# Patient Record
Sex: Female | Born: 1985 | Race: White | Hispanic: No | Marital: Single | Smoking: Current every day smoker
Health system: Southern US, Community
[De-identification: ages and names within clinical notes are randomized; demographics above are authoritative.]

## PROBLEM LIST (undated history)

## (undated) DIAGNOSIS — J45909 Unspecified asthma, uncomplicated: Secondary | ICD-10-CM

---

## 2007-11-07 ENCOUNTER — Ambulatory Visit: Payer: Self-pay | Admitting: Internal Medicine

## 2010-02-07 ENCOUNTER — Ambulatory Visit: Payer: Self-pay | Admitting: Internal Medicine

## 2010-02-08 ENCOUNTER — Emergency Department: Payer: Self-pay | Admitting: Emergency Medicine

## 2010-02-08 ENCOUNTER — Inpatient Hospital Stay: Payer: Self-pay | Admitting: Specialist

## 2011-09-08 ENCOUNTER — Emergency Department: Payer: Self-pay | Admitting: Emergency Medicine

## 2017-09-24 ENCOUNTER — Encounter: Payer: Self-pay | Admitting: Emergency Medicine

## 2017-09-24 ENCOUNTER — Emergency Department: Payer: Self-pay

## 2017-09-24 ENCOUNTER — Emergency Department
Admission: EM | Admit: 2017-09-24 | Discharge: 2017-09-24 | Disposition: A | Discharge status: Home / Self Care | Payer: Self-pay | Attending: Student in an Organized Health Care Education/Training Program | Admitting: Student in an Organized Health Care Education/Training Program

## 2017-09-24 DIAGNOSIS — J40 Bronchitis, not specified as acute or chronic: Secondary | ICD-10-CM | POA: Insufficient documentation

## 2017-09-24 DIAGNOSIS — F17200 Nicotine dependence, unspecified, uncomplicated: Secondary | ICD-10-CM | POA: Insufficient documentation

## 2017-09-24 HISTORY — DX: Unspecified asthma, uncomplicated: J45.909

## 2017-09-24 MED ORDER — BENZONATATE 100 MG PO CAPS: ORAL_CAPSULE | ORAL | 0 refills | Status: DC

## 2017-09-24 MED ORDER — IPRATROPIUM-ALBUTEROL 0.5-2.5 (3) MG/3ML IN SOLN
3.0000 mL | Freq: Once | RESPIRATORY_TRACT | Status: AC
  Administered 2017-09-24: 3 mL via RESPIRATORY_TRACT
  Filled 2017-09-24: qty 3

## 2017-09-24 MED ORDER — PREDNISONE 20 MG PO TABS
60.0000 mg | ORAL_TABLET | Freq: Once | ORAL | Status: AC
  Administered 2017-09-24: 60 mg via ORAL
  Filled 2017-09-24: qty 3

## 2017-09-24 MED ORDER — PREDNISONE 10 MG (21) PO TBPK: ORAL_TABLET | ORAL | 0 refills | Status: DC

## 2017-09-24 NOTE — ED Provider Notes (Signed)
[facility] Emergency Department Provider Note ____________________________________________  Time seen: 1312  I have reviewed the triage vital signs and the nursing notes.  HISTORY  Chief Complaint  Shortness of Breath  HPI Sally Finley is a 31 y.o. female presents to the ED for a one-week complaint of) cough and congestion. Patient describes she was treating herself at home for cold symptoms about 2 weeks prior. She been taking DayQuil and NyQuil without significant benefit. She describes symptoms resolved and then about 3 days prior symptoms returned. She presents now with chest tightness, shortness of breath, and intermittently productive cough. She denies any interim fevers, chills, or sweats. She also denies any recent travel, sick contacts, or other exposures.  She does admit to one episode of cough-induced vomiting this morning.   Past Medical History:  Diagnosis Date  . Asthma     There are no active problems to display for this patient.  History reviewed. No pertinent surgical history.  Prior to Admission medications   Medication Sig Start Date End Date Taking? Authorizing Provider  benzonatate (TESSALON PERLES) 100 MG capsule Take 1-2 tabs TID prn cough 09/24/17   Zeven Kocak, Charlesetta IvoryJenise V Bacon, PA-C  predniSONE (STERAPRED UNI-PAK 21 TAB) 10 MG (21) TBPK tablet 6-day taper as directed. 09/24/17   Alisea Matte, Charlesetta IvoryJenise V Bacon, PA-C    Allergies Morphine and related and Phenergan [promethazine hcl]  History reviewed. No pertinent family history.  Social History Social History  Substance Use Topics  . Smoking status: Current Every Day Smoker  . Smokeless tobacco: Never Used  . Alcohol use No   Review of Systems  Constitutional: Negative for fever. Eyes: Negative for visual changes. ENT: Negative for sore throat. Cardiovascular: Negative for chest pain. Respiratory:  Positive for shortness of breath. Intermittently productive cough Gastrointestinal:  Negative for abdominal pain, vomiting and diarrhea. Skin: Negative for rash. Neurological: Negative for headaches, focal weakness or numbness. ____________________________________________  PHYSICAL EXAM:  VITAL SIGNS: ED Triage Vitals [09/24/17 1127]  Enc Vitals Group     BP 109/78     Pulse Rate (!) 54     Resp 20     Temp (!) 97.3 F (36.3 C)     Temp Source Oral     SpO2 95 %     Weight 175 lb (79.4 kg)     Height      Head Circumference      Peak Flow      Pain Score 0     Pain Loc      Pain Edu?      Excl. in GC?    Constitutional: Alert and oriented. Well appearing and in no distress. Head: Normocephalic and atraumatic. Eyes: Conjunctivae are normal. PERRL. Normal extraocular movements Ears: Canals clear. TMs intact bilaterally. Nose: No congestion/rhinorrhea/epistaxis. Mouth/Throat: Mucous membranes are moist. Uvula is midline and tonsils are flat. Neck: Supple. No thyromegaly. Hematological/Lymphatic/Immunological: No cervical lymphadenopathy. Cardiovascular: Normal rate, regular rhythm. Normal distal pulses. Respiratory: Normal respiratory effort. Mild end-expiratory wheeze noted bilaterally. No rales/rhonchi. Musculoskeletal: Nontender with normal range of motion in all extremities.  Neurologic:  Normal gait without ataxia. Normal speech and language. No gross focal neurologic deficits are appreciated. Skin:  Skin is warm, dry and intact. No rash noted. ____________________________________________   RADIOLOGY  CXR  IMPRESSION: No edema or consolidation. ____________________________________________  PROCEDURES  DuoNeb x 1 Prednisone 60 mg PO ____________________________________________  INITIAL IMPRESSION / ASSESSMENT AND PLAN / ED COURSE  Patient with ED evaluation of persistent  cough for the last 3-4 weeks. The patient was and was overall benign without any acute respiratory distress. X-ray did not show any acute infectious process. Patient was  treated for a viral bronchitis at this time. She given a DuoNeb treatment in the ED and a dose of prednisone here. She'll be discharged with a prescription for a prednisone taper pack as well as Tessalon Perles. She is encouraged to start an over-the-counter daily allergy medicine as well as Delsym for cough suppression. She [facility] or return to the ED as needed. ____________________________________________  FINAL CLINICAL IMPRESSION(S) / ED DIAGNOSES  Final diagnoses:  Bronchitis      Myranda Pavone, Charlesetta Ivory, PA-C 09/24/17 1656    Willy Eddy, MD 09/26/17 [phone]

## 2017-09-24 NOTE — ED Notes (Signed)
NAD noted at time of D/C. Pt denies questions or concerns. Pt ambulatory to the lobby at this time.  

## 2017-09-24 NOTE — Discharge Instructions (Signed)
Your exam and chest x-ray are consistent with a likely viral bronchitis. Take the prescription meds as directed. Use your inhaler as prescribed. Take OTC Delsym for additional cough relief. Follow-up with your provider or [facility] for continued symptoms.

## 2017-09-24 NOTE — ED Triage Notes (Addendum)
Pt to ed with c/o cough and congestion x 1 month,  States she now feels like she can't breathe well.  resp even and unlabored.  Pt sats wnl.  RR 20.  Skin warm and dry.  Pt alert and oriented.

## 2018-01-09 ENCOUNTER — Emergency Department
Admission: EM | Admit: 2018-01-09 | Discharge: 2018-01-09 | Disposition: A | Discharge status: Home / Self Care | Payer: Self-pay | Attending: Emergency Medicine | Admitting: Emergency Medicine

## 2018-01-09 ENCOUNTER — Other Ambulatory Visit: Payer: Self-pay

## 2018-01-09 ENCOUNTER — Encounter: Payer: Self-pay | Admitting: Emergency Medicine

## 2018-01-09 DIAGNOSIS — Z87891 Personal history of nicotine dependence: Secondary | ICD-10-CM | POA: Insufficient documentation

## 2018-01-09 DIAGNOSIS — J101 Influenza due to other identified influenza virus with other respiratory manifestations: Secondary | ICD-10-CM | POA: Insufficient documentation

## 2018-01-09 DIAGNOSIS — J45909 Unspecified asthma, uncomplicated: Secondary | ICD-10-CM | POA: Insufficient documentation

## 2018-01-09 LAB — GROUP A STREP BY PCR: GROUP A STREP BY PCR: NOT DETECTED

## 2018-01-09 MED ORDER — IPRATROPIUM-ALBUTEROL 0.5-2.5 (3) MG/3ML IN SOLN
3.0000 mL | Freq: Once | RESPIRATORY_TRACT | Status: AC
  Administered 2018-01-09: 3 mL via RESPIRATORY_TRACT
  Filled 2018-01-09: qty 3

## 2018-01-09 MED ORDER — ALBUTEROL SULFATE HFA 108 (90 BASE) MCG/ACT IN AERS: 2.0000 | INHALATION_SPRAY | Freq: Four times a day (QID) | RESPIRATORY_TRACT | 2 refills | Status: DC | PRN

## 2018-01-09 MED ORDER — OSELTAMIVIR PHOSPHATE 75 MG PO CAPS: 75.0000 mg | ORAL_CAPSULE | Freq: Two times a day (BID) | ORAL | 0 refills | Status: AC

## 2018-01-09 NOTE — ED Triage Notes (Signed)
Patient with complaint of fever, congestion, body aches and sore throat times two days. Patient states that her temperature was 102 last night.

## 2018-01-09 NOTE — ED Notes (Signed)
Pt presents with c/o flu like symptoms; given mask to put on but pt refusing as she has asthma

## 2018-01-09 NOTE — ED Provider Notes (Signed)
[facility] Emergency Department Provider Note  ____________________________________________  Time seen: Approximately 10:20 PM  I have reviewed the triage vital signs and the nursing notes.   HISTORY  Chief Complaint Fever; Nasal Congestion; and Generalized Body Aches    HPI Sally Finley is a 32 y.o. female presents to the emergency department with headache, fever, pharyngitis, myalgias, diarrhea, emesis and malaise for the past 2 days.  Patient's girlfriend was diagnosed with influenza A yesterday.  Patient denies chest pain, chest tightness and abdominal pain.  No alleviating measures of been attempted.   Past Medical History:  Diagnosis Date  . Asthma     There are no active problems to display for this patient.   History reviewed. No pertinent surgical history.  Prior to Admission medications   Medication Sig Start Date End Date Taking? Authorizing Provider  benzonatate (TESSALON PERLES) 100 MG capsule Take 1-2 tabs TID prn cough 09/24/17   Menshew, Charlesetta Ivory, PA-C  oseltamivir (TAMIFLU) 75 MG capsule Take 1 capsule (75 mg total) by mouth 2 (two) times daily for 5 days. 01/09/18 01/14/18  Orvil Feil, PA-C  predniSONE (STERAPRED UNI-PAK 21 TAB) 10 MG (21) TBPK tablet 6-day taper as directed. 09/24/17   Menshew, Charlesetta Ivory, PA-C    Allergies Morphine and related and Phenergan [promethazine hcl]  No family history on file.  Social History Social History   Tobacco Use  . Smoking status: Former Games developer  . Smokeless tobacco: Never Used  Substance Use Topics  . Alcohol use: No  . Drug use: No      Review of Systems  Constitutional: Patient has fever.  Eyes: No visual changes. No discharge ENT: Patient has congestion.  Cardiovascular: no chest pain. Respiratory: Patient has cough.  Gastrointestinal: No abdominal pain.  No nausea, no vomiting. Patient had diarrhea.  Genitourinary: Negative for dysuria. No  hematuria Musculoskeletal: Patient has myalgias.  Skin: Negative for rash, abrasions, lacerations, ecchymosis. Neurological: Patient has headache, no focal weakness or numbness.    ____________________________________________   PHYSICAL EXAM:  VITAL SIGNS: ED Triage Vitals  Enc Vitals Group     BP 01/09/18 2019 104/70     Pulse Rate 01/09/18 2016 91     Resp 01/09/18 2016 18     Temp 01/09/18 2016 98.5 F (36.9 C)     Temp src --      SpO2 01/09/18 2016 98 %     Weight 01/09/18 2016 175 lb (79.4 kg)     Height 01/09/18 2016 5\' 1"  (1.549 m)     Head Circumference --      Peak Flow --      Pain Score 01/09/18 2016 7     Pain Loc --      Pain Edu? --      Excl. in GC? --      Constitutional: Alert and oriented. Patient is lying supine. Eyes: Conjunctivae are normal. PERRL. EOMI. Head: Atraumatic. ENT:      Ears: Tympanic membranes are mildly injected with mild effusion bilaterally.       Nose: No congestion/rhinnorhea.      Mouth/Throat: Mucous membranes are moist. Posterior pharynx is mildly erythematous.  Hematological/Lymphatic/Immunilogical: No cervical lymphadenopathy.  Cardiovascular: Normal rate, regular rhythm. Normal S1 and S2.  Good peripheral circulation. Respiratory: Normal respiratory effort without tachypnea or retractions. Lungs CTAB. Good air entry to the bases with no decreased or absent breath sounds. Gastrointestinal: Bowel sounds 4 quadrants. Soft and nontender to  palpation. No guarding or rigidity. No palpable masses. No distention. No CVA tenderness. Musculoskeletal: Full range of motion to all extremities. No gross deformities appreciated. Neurologic:  Normal speech and language. No gross focal neurologic deficits are appreciated.  Skin:  Skin is warm, dry and intact. No rash noted. Psychiatric: Mood and affect are normal. Speech and behavior are normal. Patient exhibits appropriate insight and  judgement.   ____________________________________________   LABS (all labs ordered are listed, but only abnormal results are displayed)  Labs Reviewed  GROUP A STREP BY PCR   ____________________________________________  EKG   ____________________________________________  RADIOLOGY   No results found.  ____________________________________________    PROCEDURES  Procedure(s) performed:    Procedures    Medications  ipratropium-albuterol (DUONEB) 0.5-2.5 (3) MG/3ML nebulizer solution 3 mL (not administered)     ____________________________________________   INITIAL IMPRESSION / ASSESSMENT AND PLAN / ED COURSE  Pertinent labs & imaging results that were available during my care of the patient were reviewed by me and considered in my medical decision making (see chart for details).  Review of the [place] CSRS was performed in accordance of the [facility] prior to dispensing any controlled drugs.     Assessment and plan Influenza A Differential diagnosis included influenza versus unspecified viral URI.  With sick contacts in the home with diagnosed influenza A, influenza A is likely at this time.  Patient was started on Tamiflu.  Rest and hydration were encouraged.  Patient was advised to follow-up with primary care as needed.  All patient questions were answered.    ____________________________________________  FINAL CLINICAL IMPRESSION(S) / ED DIAGNOSES  Final diagnoses:  Influenza A      NEW MEDICATIONS STARTED DURING THIS VISIT:  ED Discharge Orders        Ordered    oseltamivir (TAMIFLU) 75 MG capsule  2 times daily     01/09/18 2215          This chart was dictated using voice recognition software/Dragon. Despite best efforts to proofread, errors can occur which can change the meaning. Any change was purely unintentional.    Orvil FeilWoods, Jacki Couse M, PA-C 01/09/18 2228    Pia MauWoods, Elana Jian KalifornskyM, PA-C 01/09/18 2236    Jeanmarie PlantMcShane, James A, MD 01/09/18  [phone]

## 2018-04-15 ENCOUNTER — Ambulatory Visit
Admission: EM | Admit: 2018-04-15 | Discharge: 2018-04-15 | Disposition: A | Discharge status: Home / Self Care | Payer: Self-pay | Attending: Family Medicine | Admitting: Family Medicine

## 2018-04-15 ENCOUNTER — Other Ambulatory Visit: Payer: Self-pay

## 2018-04-15 DIAGNOSIS — H6692 Otitis media, unspecified, left ear: Secondary | ICD-10-CM

## 2018-04-15 DIAGNOSIS — J4521 Mild intermittent asthma with (acute) exacerbation: Secondary | ICD-10-CM

## 2018-04-15 MED ORDER — HYDROCOD POLST-CPM POLST ER 10-8 MG/5ML PO SUER: 5.0000 mL | Freq: Every evening | ORAL | 0 refills | Status: DC | PRN

## 2018-04-15 MED ORDER — AMOXICILLIN 875 MG PO TABS: 875.0000 mg | ORAL_TABLET | Freq: Two times a day (BID) | ORAL | 0 refills | Status: DC

## 2018-04-15 MED ORDER — PREDNISONE 50 MG PO TABS: ORAL_TABLET | ORAL | 0 refills | Status: DC

## 2018-04-15 MED ORDER — ALBUTEROL SULFATE HFA 108 (90 BASE) MCG/ACT IN AERS: 1.0000 | INHALATION_SPRAY | Freq: Four times a day (QID) | RESPIRATORY_TRACT | 1 refills | Status: DC | PRN

## 2018-04-15 MED ORDER — ALBUTEROL SULFATE (2.5 MG/3ML) 0.083% IN NEBU
2.5000 mg | INHALATION_SOLUTION | Freq: Once | RESPIRATORY_TRACT | Status: AC
  Administered 2018-04-15: 2.5 mg via RESPIRATORY_TRACT

## 2018-04-15 NOTE — Discharge Instructions (Signed)
Meds as prescribed. ° °Take care ° °Dr. Harleigh Civello  °

## 2018-04-15 NOTE — ED Provider Notes (Signed)
[facility]  CSN: [id number] Arrival date & time: 04/15/18  1358  History   Chief Complaint Chief Complaint  Patient presents with  . Cough   HPI  32 year old female presents with respiratory symptoms.  Patient has been sick for the past week.  She is had sore throat, congestion, ear pain, cough and wheezing.  No documented fever.  She states that she has asthma and is out of her inhaler.  She is having difficulty breathing.  Worse at night.  No known relieving factors.  No other associated symptoms.  No other complaints or concerns at this time.  Past Medical History:  Diagnosis Date  . Asthma    Surgical hx: No past surgeries.  OB History   None    Family History No reported family hx.  Social History Social History   Tobacco Use  . Smoking status: Current Every Day Smoker    Packs/day: 0.50    Types: Cigarettes  . Smokeless tobacco: Never Used  Substance Use Topics  . Alcohol use: No  . Drug use: No    Allergies   Morphine and related and Phenergan [promethazine hcl]   Review of Systems Review of Systems Per HPI  Physical Exam Triage Vital Signs ED Triage Vitals  Enc Vitals Group     BP 04/15/18 1418 117/79     Pulse Rate 04/15/18 1418 96     Resp 04/15/18 1418 20     Temp 04/15/18 1418 97.9 F (36.6 C)     Temp Source 04/15/18 1418 Oral     SpO2 04/15/18 1418 96 %     Weight 04/15/18 1417 170 lb (77.1 kg)     Height 04/15/18 1417  (1.549 m)     Head Circumference --      Peak Flow --      Pain Score 04/15/18 1417 3     Pain Loc --      Pain Edu? --      Excl. in GC? --    Updated Vital Signs BP 117/79 (BP Location: Right Arm)   Pulse 96   Temp 97.9 F (36.6 C) (Oral)   Resp 20   Ht  (1.549 m)   Wt 170 lb (77.1 kg)   LMP 04/06/2018   SpO2 96%   BMI 32.12 kg/m   Physical Exam  Constitutional: She is oriented to person, place, and time. She appears well-developed. No distress.  HENT:  Head: Normocephalic and  atraumatic.  Oropharynx with enlarged tonsils.  Mild erythema. Left TM with erythema and dullness.  Eyes: Conjunctivae are normal. Right eye exhibits no discharge. Left eye exhibits no discharge.  Cardiovascular: Normal rate and regular rhythm.  Pulmonary/Chest:  No increased work of breathing.  Diffuse expiratory wheezing.  Neurological: She is alert and oriented to person, place, and time.  Psychiatric: She has a normal mood and affect. Her behavior is normal.  Nursing note and vitals reviewed.  UC Treatments / Results  Labs (all labs ordered are listed, but only abnormal results are displayed) Labs Reviewed - No data to display  EKG None  Radiology No results found.  Procedures Procedures (including critical care time)  Medications Ordered in UC Medications  albuterol (PROVENTIL) (2.5 MG/3ML) 0.083% nebulizer solution 2.5 mg (2.5 mg Nebulization Given 04/15/18 1435)    Initial Impression / Assessment and Plan / UC Course  I have reviewed the triage vital signs and the nursing notes.  Pertinent labs & imaging results that  were available during my care of the patient were reviewed by me and considered in my medical decision making (see chart for details).    32 year old female presents with asthma exacerbation.  Treating with prednisone, albuterol.  Tussionex for cough.  Also noted to have otitis media.  Treating with amoxicillin.  Final Clinical Impressions(s) / UC Diagnoses   Final diagnoses:  Mild intermittent asthma with exacerbation  Left otitis media, unspecified otitis media type     Discharge Instructions     Meds as prescribed.  Take care  Dr. Adriana Simas    ED Prescriptions    Medication Sig Dispense Auth. Provider   albuterol (PROVENTIL HFA;VENTOLIN HFA) 108 (90 Base) MCG/ACT inhaler Inhale 1-2 puffs into the lungs every 6 (six) hours as needed for wheezing or shortness of breath. 2 Inhaler Lauris Keepers G, DO   chlorpheniramine-HYDROcodone (TUSSIONEX  PENNKINETIC ER) 10-8 MG/5ML SUER Take 5 mLs by mouth at bedtime as needed. 60 mL Jeral Zick G, DO   predniSONE (DELTASONE) 50 MG tablet 1 tablet daily x 5 days. 5 tablet Makenzye Troutman G, DO   amoxicillin (AMOXIL) 875 MG tablet Take 1 tablet (875 mg total) by mouth 2 (two) times daily. 14 tablet Tommie Sams, DO     Controlled Substance Prescriptions [place] Controlled Substance Registry consulted? Not Applicable   Tommie Sams, DO 04/15/18 1503

## 2018-04-15 NOTE — ED Triage Notes (Signed)
Pt is out of her inhaler a few months ago. She has been having cold sx over last week and feels like its harder to breath when she's lying down at night. Needs new inhaler prescription.

## 2018-07-24 ENCOUNTER — Ambulatory Visit
Admission: EM | Admit: 2018-07-24 | Discharge: 2018-07-24 | Disposition: A | Discharge status: Home / Self Care | Payer: Self-pay | Attending: Internal Medicine | Admitting: Internal Medicine

## 2018-07-24 ENCOUNTER — Other Ambulatory Visit: Payer: Self-pay

## 2018-07-24 ENCOUNTER — Encounter: Payer: Self-pay | Admitting: Gynecology

## 2018-07-24 DIAGNOSIS — J0120 Acute ethmoidal sinusitis, unspecified: Secondary | ICD-10-CM

## 2018-07-24 DIAGNOSIS — R062 Wheezing: Secondary | ICD-10-CM

## 2018-07-24 MED ORDER — AMOXICILLIN 875 MG PO TABS: 875.0000 mg | ORAL_TABLET | Freq: Two times a day (BID) | ORAL | 0 refills | Status: DC

## 2018-07-24 MED ORDER — FLUTICASONE PROPIONATE 50 MCG/ACT NA SUSP: 1.0000 | Freq: Every day | NASAL | 2 refills | Status: DC

## 2018-07-24 MED ORDER — IPRATROPIUM-ALBUTEROL 0.5-2.5 (3) MG/3ML IN SOLN
3.0000 mL | Freq: Once | RESPIRATORY_TRACT | Status: AC
  Administered 2018-07-24: 3 mL via RESPIRATORY_TRACT

## 2018-07-24 MED ORDER — ALBUTEROL SULFATE HFA 108 (90 BASE) MCG/ACT IN AERS: 1.0000 | INHALATION_SPRAY | Freq: Four times a day (QID) | RESPIRATORY_TRACT | 1 refills | Status: DC | PRN

## 2018-07-24 NOTE — ED Provider Notes (Signed)
[facility]    CSN: [id number] Arrival date & time: 07/24/18  1309     History   Chief Complaint Chief Complaint  Patient presents with  . Sinusitis    HPI Sally Finley is a 32 y.o. female.   Patient complains of facial pressure and nasal drainage x1 week. Reports subjective fevers. Drainage is dark yellow.      Past Medical History:  Diagnosis Date  . Asthma     There are no active problems to display for this patient.   History reviewed. No pertinent surgical history.  OB History   None      Home Medications    Prior to Admission medications   Medication Sig Start Date End Date Taking? Authorizing Provider  albuterol (PROVENTIL HFA;VENTOLIN HFA) 108 (90 Base) MCG/ACT inhaler Inhale 1-2 puffs into the lungs every 6 (six) hours as needed for wheezing or shortness of breath. 07/24/18   Arnaldo Nataliamond, Daena Alper S, MD  amoxicillin (AMOXIL) 875 MG tablet Take 1 tablet (875 mg total) by mouth 2 (two) times daily. 07/24/18   Arnaldo Nataliamond, Keymari Sato S, MD  chlorpheniramine-HYDROcodone [facility](TUSSIONEX PENNKINETIC ER) 10-8 MG/5ML SUER Take 5 mLs by mouth at bedtime as needed. 04/15/18   Tommie Samsook, Jayce G, DO  fluticasone (FLONASE) 50 MCG/ACT nasal spray Place 1 spray into both nostrils daily. 07/24/18   Arnaldo Nataliamond, Edgard Debord S, MD  predniSONE (DELTASONE) 50 MG tablet 1 tablet daily x 5 days. 04/15/18   Tommie Samsook, Jayce G, DO    Family History History reviewed. No pertinent family history.  Social History Social History   Tobacco Use  . Smoking status: Current Every Day Smoker    Packs/day: 0.50    Types: Cigarettes  . Smokeless tobacco: Never Used  Substance Use Topics  . Alcohol use: No  . Drug use: No     Allergies   Morphine and related and Phenergan [promethazine hcl]   Review of Systems Review of Systems  Constitutional: Positive for fever (subjective). Negative for chills.  HENT: Positive for congestion. Negative for sore throat and tinnitus.   Eyes: Negative for  redness.  Respiratory: Negative for cough and shortness of breath.   Cardiovascular: Negative for chest pain and palpitations.  Gastrointestinal: Negative for abdominal pain, diarrhea, nausea and vomiting.  Genitourinary: Negative for dysuria, frequency and urgency.  Musculoskeletal: Negative for myalgias.  Skin: Negative for rash.       No lesions  Neurological: Negative for weakness.  Hematological: Does not bruise/bleed easily.  Psychiatric/Behavioral: Negative for suicidal ideas.     Physical Exam Triage Vital Signs ED Triage Vitals  Enc Vitals Group     BP 07/24/18 1318 (!) 126/94     Pulse Rate 07/24/18 1318 85     Resp --      Temp 07/24/18 1318 99.1 F (37.3 C)     Temp Source 07/24/18 1318 Oral     SpO2 07/24/18 1318 99 %     Weight 07/24/18 1316 170 lb (77.1 kg)     Height 07/24/18 1316 5' (1.524 m)     Head Circumference --      Peak Flow --      Pain Score 07/24/18 1315 5     Pain Loc --      Pain Edu? --      Excl. in GC? --    No data found.  Updated Vital Signs BP (!) 126/94   Pulse 85   Temp 99.1 F (37.3 C) (Oral)  Ht 5' (1.524 m)   Wt 77.1 kg   LMP 07/03/2018   SpO2 99%   BMI 33.20 kg/m   Visual Acuity Right Eye Distance:   Left Eye Distance:   Bilateral Distance:    Right Eye Near:   Left Eye Near:    Bilateral Near:     Physical Exam  Constitutional: She is oriented to person, place, and time. She appears well-developed and well-nourished. No distress.  HENT:  Head: Normocephalic and atraumatic.  Mouth/Throat: Oropharynx is clear and moist.  Eyes: Pupils are equal, round, and reactive to light. Conjunctivae and EOM are normal. No scleral icterus.  Neck: Normal range of motion. Neck supple. No JVD present. No tracheal deviation present. No thyromegaly present.  Cardiovascular: Normal rate, regular rhythm and normal heart sounds. Exam reveals no gallop and no friction rub.  No murmur heard. Pulmonary/Chest: Effort normal. She has  wheezes (faint bilaterally).  Abdominal: Soft. Bowel sounds are normal. She exhibits no distension. There is no tenderness.  Musculoskeletal: Normal range of motion. She exhibits no edema.  Lymphadenopathy:    She has no cervical adenopathy.  Neurological: She is alert and oriented to person, place, and time. No cranial nerve deficit.  Skin: Skin is warm and dry.  Psychiatric: She has a normal mood and affect. Her behavior is normal. Judgment and thought content normal.  Nursing note and vitals reviewed.    UC Treatments / Results  Labs (all labs ordered are listed, but only abnormal results are displayed) Labs Reviewed - No data to display  EKG None  Radiology No results found.  Procedures Procedures (including critical care time)  Medications Ordered in UC Medications  ipratropium-albuterol (DUONEB) 0.5-2.5 (3) MG/3ML nebulizer solution 3 mL (3 mLs Nebulization Given 07/24/18 1334)    Initial Impression / Assessment and Plan / UC Course  I have reviewed the triage vital signs and the nursing notes.  Pertinent labs & imaging results that were available during my care of the patient were reviewed by me and considered in my medical decision making (see chart for details).     Sinus infx; breathing treatment for active wheezing. Patient improved  Final Clinical Impressions(s) / UC Diagnoses   Final diagnoses:  Acute non-recurrent ethmoidal sinusitis  Wheezing   Discharge Instructions   None    ED Prescriptions    Medication Sig Dispense Auth. Provider   amoxicillin (AMOXIL) 875 MG tablet Take 1 tablet (875 mg total) by mouth 2 (two) times daily. 14 tablet Arnaldo Nataliamond, Webber Michiels S, MD   albuterol (PROVENTIL HFA;VENTOLIN HFA) 108 (90 Base) MCG/ACT inhaler Inhale 1-2 puffs into the lungs every 6 (six) hours as needed for wheezing or shortness of breath. 2 Inhaler Arnaldo Nataliamond, Airyn Ellzey S, MD   fluticasone [facility](FLONASE) 50 MCG/ACT nasal spray Place 1 spray into both nostrils daily. 16  g Arnaldo Nataliamond, Leonard Feigel S, MD     Controlled Substance Prescriptions [place] Controlled Substance Registry consulted? Not Applicable   Arnaldo Nataliamond, Euretha Najarro S, MD 07/24/18 1401

## 2018-07-24 NOTE — ED Triage Notes (Signed)
Patient c/o sinus problem x 1 week. 

## 2018-12-01 ENCOUNTER — Emergency Department
Admission: EM | Admit: 2018-12-01 | Discharge: 2018-12-01 | Disposition: A | Discharge status: Home / Self Care | Payer: Self-pay | Attending: Emergency Medicine | Admitting: Emergency Medicine

## 2018-12-01 ENCOUNTER — Emergency Department: Payer: Self-pay

## 2018-12-01 DIAGNOSIS — Z79899 Other long term (current) drug therapy: Secondary | ICD-10-CM | POA: Insufficient documentation

## 2018-12-01 DIAGNOSIS — J45909 Unspecified asthma, uncomplicated: Secondary | ICD-10-CM | POA: Insufficient documentation

## 2018-12-01 DIAGNOSIS — R070 Pain in throat: Secondary | ICD-10-CM | POA: Insufficient documentation

## 2018-12-01 DIAGNOSIS — B9789 Other viral agents as the cause of diseases classified elsewhere: Secondary | ICD-10-CM | POA: Insufficient documentation

## 2018-12-01 DIAGNOSIS — R05 Cough: Secondary | ICD-10-CM | POA: Insufficient documentation

## 2018-12-01 DIAGNOSIS — F17210 Nicotine dependence, cigarettes, uncomplicated: Secondary | ICD-10-CM | POA: Insufficient documentation

## 2018-12-01 DIAGNOSIS — J069 Acute upper respiratory infection, unspecified: Secondary | ICD-10-CM | POA: Insufficient documentation

## 2018-12-01 LAB — INFLUENZA PANEL BY PCR (TYPE A & B)
INFLBPCR: NEGATIVE
Influenza A By PCR: NEGATIVE

## 2018-12-01 LAB — GROUP A STREP BY PCR: Group A Strep by PCR: NOT DETECTED

## 2018-12-01 MED ORDER — LIDOCAINE VISCOUS HCL 2 % MT SOLN: 10.0000 mL | OROMUCOSAL | 0 refills | Status: DC | PRN

## 2018-12-01 MED ORDER — IPRATROPIUM-ALBUTEROL 0.5-2.5 (3) MG/3ML IN SOLN
3.0000 mL | Freq: Once | RESPIRATORY_TRACT | Status: AC
  Administered 2018-12-01: 3 mL via RESPIRATORY_TRACT
  Filled 2018-12-01: qty 3

## 2018-12-01 MED ORDER — ALBUTEROL SULFATE HFA 108 (90 BASE) MCG/ACT IN AERS: 2.0000 | INHALATION_SPRAY | Freq: Four times a day (QID) | RESPIRATORY_TRACT | 0 refills | Status: DC | PRN

## 2018-12-01 MED ORDER — PREDNISONE 10 MG PO TABS: ORAL_TABLET | ORAL | 0 refills | Status: DC

## 2018-12-01 MED ORDER — GUAIFENESIN-DM 100-10 MG/5ML PO SYRP: 5.0000 mL | ORAL_SOLUTION | ORAL | 0 refills | Status: DC | PRN

## 2018-12-01 MED ORDER — METHYLPREDNISOLONE SODIUM SUCC 125 MG IJ SOLR
125.0000 mg | Freq: Once | INTRAMUSCULAR | Status: AC
  Administered 2018-12-01: 125 mg via INTRAMUSCULAR
  Filled 2018-12-01: qty 2

## 2018-12-01 NOTE — ED Triage Notes (Signed)
Patient c/o productive cough, congestion, and sore throat X 3 days.

## 2018-12-01 NOTE — ED Provider Notes (Signed)
[facility] Emergency Department Provider Note  ____________________________________________  Time seen: Approximately 10:03 PM  I have reviewed the triage vital signs and the nursing notes.   HISTORY  Chief Complaint Cough and Sore Throat    HPI Sally Finley is a 32 y.o. female that presents to the  emergency department for evaluation of nasal congestion, sore throat, nonproductive cough for 3 days.  Sore throat is the worst.  Patient states that it feels like she has a common cold.  Patient smokes at least 1 pack of cigarettes per day.  She has a history of asthma and uses an inhaler but is out of her inhaler.  No fever, shortness of breath, chest pain, nausea, vomiting, abdominal pain, diarrhea.   Past Medical History:  Diagnosis Date  . Asthma     There are no active problems to display for this patient.   History reviewed. No pertinent surgical history.  Prior to Admission medications   Medication Sig Start Date End Date Taking? Authorizing Provider  albuterol (PROVENTIL HFA;VENTOLIN HFA) 108 (90 Base) MCG/ACT inhaler Inhale 2 puffs into the lungs every 6 (six) hours as needed for wheezing or shortness of breath. 12/01/18   Enid DerryWagner, Currie Dennin, PA-C  amoxicillin (AMOXIL) 875 MG tablet Take 1 tablet (875 mg total) by mouth 2 (two) times daily. 07/24/18   Arnaldo Nataliamond, Michael S, MD  chlorpheniramine-HYDROcodone [facility](TUSSIONEX PENNKINETIC ER) 10-8 MG/5ML SUER Take 5 mLs by mouth at bedtime as needed. 04/15/18   Tommie Samsook, Jayce G, DO  fluticasone (FLONASE) 50 MCG/ACT nasal spray Place 1 spray into both nostrils daily. 07/24/18   Arnaldo Nataliamond, Michael S, MD  guaiFENesin-dextromethorphan (ROBITUSSIN DM) 100-10 MG/5ML syrup Take 5 mLs by mouth every 4 (four) hours as needed for cough. 12/01/18   Enid DerryWagner, Roxie Kreeger, PA-C  lidocaine (XYLOCAINE) 2 % solution Use as directed 10 mLs in the mouth or throat as needed for mouth pain. 12/01/18   Enid DerryWagner, Bilal Manzer, PA-C  predniSONE (DELTASONE) 10 MG  tablet Take 6 tablets on day 1, take 5 tablets on day 2, take 4 tablets on day 3, take 3 tablets on day 4, take 2 tablets on day 5, take 1 tablet on day 6 12/01/18   Enid DerryWagner, Hezekiah Veltre, PA-C    Allergies Morphine and related and Phenergan [promethazine hcl]  No family history on file.  Social History Social History   Tobacco Use  . Smoking status: Current Every Day Smoker    Packs/day: 0.50    Types: Cigarettes  . Smokeless tobacco: Never Used  Substance Use Topics  . Alcohol use: No    Frequency: Never  . Drug use: No     Review of Systems  Constitutional: No fever/chills Eyes: No visual changes. No discharge. ENT: Positive for congestion and rhinorrhea. Cardiovascular: No chest pain. Respiratory: Positive for cough. No SOB. Gastrointestinal: No abdominal pain.  No nausea, no vomiting.  No diarrhea.  No constipation. Musculoskeletal: Negative for musculoskeletal pain. Skin: Negative for rash, abrasions, lacerations, ecchymosis. Neurological: Negative for headaches.   ____________________________________________   PHYSICAL EXAM:  VITAL SIGNS: ED Triage Vitals  Enc Vitals Group     BP 12/01/18 1910 (!) 127/102     Pulse Rate 12/01/18 1910 (!) 109     Resp 12/01/18 1910 18     Temp 12/01/18 1910 98.6 F (37 C)     Temp Source 12/01/18 1910 Oral     SpO2 12/01/18 1910 97 %     Weight 12/01/18 1911 180 lb (81.6 kg)  Height 12/01/18 1911 5\' 1"  (1.549 m)     Head Circumference --      Peak Flow --      Pain Score 12/01/18 1910 8     Pain Loc --      Pain Edu? --      Excl. in GC? --      Constitutional: Alert and oriented. Well appearing and in no acute distress. Eyes: Conjunctivae are normal. PERRL. EOMI. No discharge. Head: Atraumatic. ENT: No frontal and maxillary sinus tenderness.      Ears: Tympanic membranes pearly gray with good landmarks. No discharge.      Nose: Mild congestion/rhinnorhea.      Mouth/Throat: Mucous membranes are moist. Oropharynx  erythematous. Tonsils not enlarged. No exudates. Uvula midline. Neck: No stridor.   Hematological/Lymphatic/Immunilogical: No cervical lymphadenopathy. Cardiovascular: Normal rate, regular rhythm.  Good peripheral circulation. Respiratory: Normal respiratory effort without tachypnea or retractions. Lungs CTAB. Good air entry to the bases with no decreased or absent breath sounds. Gastrointestinal: Bowel sounds 4 quadrants. Soft and nontender to palpation. No guarding or rigidity. No palpable masses. No distention. Musculoskeletal: Full range of motion to all extremities. No gross deformities appreciated. Neurologic:  Normal speech and language. No gross focal neurologic deficits are appreciated.  Skin:  Skin is warm, dry and intact. No rash noted. Psychiatric: Mood and affect are normal. Speech and behavior are normal. Patient exhibits appropriate insight and judgement.   ____________________________________________   LABS (all labs ordered are listed, but only abnormal results are displayed)  Labs Reviewed  GROUP A STREP BY PCR  INFLUENZA PANEL BY PCR (TYPE A & B)   ____________________________________________  EKG   ____________________________________________  RADIOLOGY Lexine BatonI, Cinzia Devos, personally viewed and evaluated these images (plain radiographs) as part of my medical decision making, as well as reviewing the written report by the radiologist.  Dg Chest 2 View  Result Date: 12/01/2018 CLINICAL DATA:  Cough and congestion EXAM: CHEST - 2 VIEW COMPARISON:  September 24, 2017 FINDINGS: The lungs are clear. The heart size and pulmonary vascularity are normal. No adenopathy. No bone lesions. IMPRESSION: No edema or consolidation. Electronically Signed   By: Bretta BangWilliam  Woodruff III M.D.   On: 12/01/2018 19:27    ____________________________________________    PROCEDURES  Procedure(s) performed:    Procedures    Medications  ipratropium-albuterol (DUONEB) 0.5-2.5 (3)  MG/3ML nebulizer solution 3 mL (3 mLs Nebulization Given 12/01/18 2105)  methylPREDNISolone sodium succinate (SOLU-MEDROL) 125 mg/2 mL injection 125 mg (125 mg Intramuscular Given 12/01/18 2218)     ____________________________________________   INITIAL IMPRESSION / ASSESSMENT AND PLAN / ED COURSE  Pertinent labs & imaging results that were available during my care of the patient were reviewed by me and considered in my medical decision making (see chart for details).  Review of the [place] CSRS was performed in accordance of the [facility] prior to dispensing any controlled drugs.     Patient's diagnosis is consistent with viral URI with cough and congestion and asthma. Vital signs and exam are reassuring.  Influenza and strep tests are negative.  Chest x-ray negative for acute cardiopulmonary processes.  Patient appears well and is staying well hydrated. Patient should alternate tylenol and ibuprofen for fever. Patient feels comfortable going home. Patient will be discharged home with prescriptions for prednisone, Flonase, viscous lidocaine, Robitussin, albuterol inhaler. Patient is to follow up with primary care as needed or otherwise directed. Patient is given ED precautions to return to the ED for any  worsening or new symptoms.     ____________________________________________  FINAL CLINICAL IMPRESSION(S) / ED DIAGNOSES  Final diagnoses:  Viral URI with cough      NEW MEDICATIONS STARTED DURING THIS VISIT:  ED Discharge Orders         Ordered    predniSONE (DELTASONE) 10 MG tablet     12/01/18 2216    lidocaine (XYLOCAINE) 2 % solution  As needed     12/01/18 2216    guaiFENesin-dextromethorphan (ROBITUSSIN DM) 100-10 MG/5ML syrup  Every 4 hours PRN     12/01/18 2216    albuterol (PROVENTIL HFA;VENTOLIN HFA) 108 (90 Base) MCG/ACT inhaler  Every 6 hours PRN     12/01/18 2216              This chart was dictated using voice recognition software/Dragon. Despite best  efforts to proofread, errors can occur which can change the meaning. Any change was purely unintentional.    Enid Derry, PA-C 12/01/18 2238    Dionne Bucy, MD 12/01/18 [phone]

## 2018-12-04 ENCOUNTER — Emergency Department: Payer: Self-pay

## 2018-12-04 ENCOUNTER — Encounter: Payer: Self-pay | Admitting: Emergency Medicine

## 2018-12-04 ENCOUNTER — Emergency Department
Admission: EM | Admit: 2018-12-04 | Discharge: 2018-12-04 | Disposition: A | Discharge status: Home / Self Care | Payer: Self-pay | Attending: Emergency Medicine | Admitting: Emergency Medicine

## 2018-12-04 DIAGNOSIS — R350 Frequency of micturition: Secondary | ICD-10-CM | POA: Insufficient documentation

## 2018-12-04 DIAGNOSIS — R10819 Abdominal tenderness, unspecified site: Secondary | ICD-10-CM | POA: Insufficient documentation

## 2018-12-04 DIAGNOSIS — M545 Low back pain, unspecified: Secondary | ICD-10-CM

## 2018-12-04 DIAGNOSIS — M549 Dorsalgia, unspecified: Secondary | ICD-10-CM

## 2018-12-04 DIAGNOSIS — M6289 Other specified disorders of muscle: Secondary | ICD-10-CM | POA: Insufficient documentation

## 2018-12-04 DIAGNOSIS — R059 Cough, unspecified: Secondary | ICD-10-CM

## 2018-12-04 DIAGNOSIS — R05 Cough: Secondary | ICD-10-CM

## 2018-12-04 LAB — CBC
HCT: 47.2 % — ABNORMAL HIGH (ref 36.0–46.0)
Hemoglobin: 15.4 g/dL — ABNORMAL HIGH (ref 12.0–15.0)
MCH: 30.7 pg (ref 26.0–34.0)
MCHC: 32.6 g/dL (ref 30.0–36.0)
MCV: 94 fL (ref 80.0–100.0)
PLATELETS: 217 10*3/uL (ref 150–400)
RBC: 5.02 MIL/uL (ref 3.87–5.11)
RDW: 13.2 % (ref 11.5–15.5)
WBC: 16.1 10*3/uL — ABNORMAL HIGH (ref 4.0–10.5)
nRBC: 0 % (ref 0.0–0.2)

## 2018-12-04 LAB — URINALYSIS, COMPLETE (UACMP) WITH MICROSCOPIC
BACTERIA UA: NONE SEEN
Bilirubin Urine: NEGATIVE
GLUCOSE, UA: NEGATIVE mg/dL
Ketones, ur: NEGATIVE mg/dL
LEUKOCYTES UA: NEGATIVE
Nitrite: NEGATIVE
PROTEIN: 30 mg/dL — AB
Specific Gravity, Urine: 1.006 (ref 1.005–1.030)
pH: 6 (ref 5.0–8.0)

## 2018-12-04 LAB — BASIC METABOLIC PANEL
ANION GAP: 8 (ref 5–15)
BUN: 12 mg/dL (ref 6–20)
CALCIUM: 9.1 mg/dL (ref 8.9–10.3)
CO2: 24 mmol/L (ref 22–32)
Chloride: 106 mmol/L (ref 98–111)
Creatinine, Ser: 0.92 mg/dL (ref 0.44–1.00)
GFR calc Af Amer: >60 mL/min (ref >60)
GFR calc non Af Amer: >60 mL/min (ref >60)
GLUCOSE: 87 mg/dL (ref 70–99)
Potassium: 3.1 mmol/L — ABNORMAL LOW (ref 3.5–5.1)
Sodium: 138 mmol/L (ref 135–145)

## 2018-12-04 LAB — POCT PREGNANCY, URINE: Preg Test, Ur: NEGATIVE

## 2018-12-04 MED ORDER — IOHEXOL 300 MG/ML  SOLN
100.0000 mL | Freq: Once | INTRAMUSCULAR | Status: AC | PRN
  Administered 2018-12-04: 100 mL via INTRAVENOUS
  Filled 2018-12-04: qty 100

## 2018-12-04 MED ORDER — OXYCODONE-ACETAMINOPHEN 5-325 MG PO TABS
1.0000 | ORAL_TABLET | Freq: Once | ORAL | Status: AC
Start: 2018-12-04 — End: 2018-12-04
  Administered 2018-12-04: 1 via ORAL
  Filled 2018-12-04: qty 1

## 2018-12-04 MED ORDER — CYCLOBENZAPRINE HCL 10 MG PO TABS
10.0000 mg | ORAL_TABLET | Freq: Once | ORAL | Status: AC
  Administered 2018-12-04: 10 mg via ORAL
  Filled 2018-12-04: qty 1

## 2018-12-04 MED ORDER — ACETAMINOPHEN 500 MG PO TABS
1000.0000 mg | ORAL_TABLET | Freq: Once | ORAL | Status: AC
  Administered 2018-12-04: 1000 mg via ORAL
  Filled 2018-12-04: qty 2

## 2018-12-04 MED ORDER — IOPAMIDOL (ISOVUE-300) INJECTION 61%
30.0000 mL | Freq: Once | INTRAVENOUS | Status: DC
  Filled 2018-12-04: qty 30

## 2018-12-04 MED ORDER — CYCLOBENZAPRINE HCL 5 MG PO TABS: 5.0000 mg | ORAL_TABLET | Freq: Three times a day (TID) | ORAL | 0 refills | Status: DC | PRN

## 2018-12-04 NOTE — ED Provider Notes (Signed)
[facility] EMERGENCY DEPARTMENT Provider Note   CSN: [id number] Arrival date & time: 12/04/18  1819     History   Chief Complaint Chief Complaint  Patient presents with  . Back Pain  . Urinary Frequency    HPI ADALEI NOVELL is a 32 y.o. female presents the emergency department for evaluation of lower back pain since yesterday and difficulty urinating.  Patient was recently seen 3 days ago for upper respiratory infection placed on antibiotics and steroid taper.  She states her cough and has been so bad she feels like she is got tightness in her lower back with a pulled muscle.  She denies any increase in urinary frequency.  No burning, blood with urination.  No vaginal discharge.  She denies any vomiting or diarrhea.  No fevers  HPI  Past Medical History:  Diagnosis Date  . Asthma     There are no active problems to display for this patient.   History reviewed. No pertinent surgical history.   OB History   No obstetric history on file.      Home Medications    Prior to Admission medications   Medication Sig Start Date End Date Taking? Authorizing Provider  albuterol (PROVENTIL HFA;VENTOLIN HFA) 108 (90 Base) MCG/ACT inhaler Inhale 2 puffs into the lungs every 6 (six) hours as needed for wheezing or shortness of breath. 12/01/18   Enid Derry, PA-C  amoxicillin (AMOXIL) 875 MG tablet Take 1 tablet (875 mg total) by mouth 2 (two) times daily. 07/24/18   Arnaldo Natal, MD  chlorpheniramine-HYDROcodone [facility] PENNKINETIC ER) 10-8 MG/5ML SUER Take 5 mLs by mouth at bedtime as needed. 04/15/18   Tommie Sams, DO  cyclobenzaprine (FLEXERIL) 5 MG tablet Take 1-2 tablets (5-10 mg total) by mouth 3 (three) times daily as needed for muscle spasms. 12/04/18   Evon Slack, PA-C  fluticasone (FLONASE) 50 MCG/ACT nasal spray Place 1 spray into both nostrils daily. 07/24/18   Arnaldo Natal, MD  guaiFENesin-dextromethorphan (ROBITUSSIN DM)  100-10 MG/5ML syrup Take 5 mLs by mouth every 4 (four) hours as needed for cough. 12/01/18   Enid Derry, PA-C  lidocaine (XYLOCAINE) 2 % solution Use as directed 10 mLs in the mouth or throat as needed for mouth pain. 12/01/18   Enid Derry, PA-C  predniSONE (DELTASONE) 10 MG tablet Take 6 tablets on day 1, take 5 tablets on day 2, take 4 tablets on day 3, take 3 tablets on day 4, take 2 tablets on day 5, take 1 tablet on day 6 12/01/18   Enid Derry, PA-C    Family History No family history on file.  Social History Social History   Tobacco Use  . Smoking status: Current Every Day Smoker    Packs/day: 0.50    Types: Cigarettes  . Smokeless tobacco: Never Used  Substance Use Topics  . Alcohol use: No    Frequency: Never  . Drug use: No     Allergies   Morphine and related and Phenergan [promethazine hcl]   Review of Systems Review of Systems  Constitutional: Negative for activity change, chills, fatigue and fever.  HENT: Negative for congestion, sinus pressure and sore throat.   Eyes: Negative for visual disturbance.  Respiratory: Negative for cough, chest tightness and shortness of breath.   Cardiovascular: Negative for chest pain and leg swelling.  Gastrointestinal: Negative for abdominal pain, diarrhea, nausea and vomiting.  Genitourinary: Positive for dysuria.  Musculoskeletal: Positive for back pain and  myalgias. Negative for arthralgias and gait problem.  Skin: Negative for rash and wound.  Neurological: Negative for weakness, numbness and headaches.  Hematological: Negative for adenopathy.  Psychiatric/Behavioral: Negative for agitation, behavioral problems and confusion.     Physical Exam Updated Vital Signs BP 129/88 (BP Location: Left Arm)   Pulse 99   Temp 98.3 F (36.8 C) (Oral)   Resp 18   Ht 5' (1.524 m)   Wt 79.4 kg   LMP 11/17/2018 (Approximate)   SpO2 98%   BMI 34.18 kg/m   Physical Exam Constitutional:      General: She is not in  acute distress.    Appearance: She is well-developed.  HENT:     Head: Normocephalic and atraumatic.  Eyes:     General:        Right eye: No discharge.        Left eye: No discharge.     Conjunctiva/sclera: Conjunctivae normal.     Pupils: Pupils are equal, round, and reactive to light.  Neck:     Musculoskeletal: Normal range of motion and neck supple.  Cardiovascular:     Rate and Rhythm: Normal rate and regular rhythm.  Pulmonary:     Effort: Pulmonary effort is normal. No respiratory distress.     Breath sounds: Normal breath sounds.  Chest:     Chest wall: No tenderness.  Abdominal:     General: There is no distension.     Palpations: Abdomen is soft.     Tenderness: There is abdominal tenderness (Minimal tenderness right lower quadrant abdomen).  Musculoskeletal: Normal range of motion.     Comments: Mild CVA tenderness bilaterally with mild right greater than left muscle spasm.  Skin:    General: Skin is warm and dry.     Findings: No rash.  Neurological:     Mental Status: She is alert and oriented to person, place, and time.     Deep Tendon Reflexes: Reflexes are normal and symmetric.  Psychiatric:        Behavior: Behavior normal.        Thought Content: Thought content normal.      ED Treatments / Results  Labs (all labs ordered are listed, but only abnormal results are displayed) Labs Reviewed  URINALYSIS, COMPLETE (UACMP) WITH MICROSCOPIC - Abnormal; Notable for the following components:      Result Value   Color, Urine STRAW (*)    APPearance CLEAR (*)    Hgb urine dipstick LARGE (*)    Protein, ur 30 (*)    All other components within normal limits  BASIC METABOLIC PANEL - Abnormal; Notable for the following components:   Potassium 3.1 (*)    All other components within normal limits  CBC - Abnormal; Notable for the following components:   WBC 16.1 (*)    Hemoglobin 15.4 (*)    HCT 47.2 (*)    All other components within normal limits  POC  URINE PREG, ED  POCT PREGNANCY, URINE    EKG None  Radiology Dg Chest 2 View  Result Date: 12/04/2018 CLINICAL DATA:  Acute onset of low back pain and dysuria that began yesterday. Patient currently undergoing antibiotic therapy for UPPER respiratory infection. Current smoker. EXAM: CHEST - 2 VIEW COMPARISON:  12/01/2018 and earlier. FINDINGS: Cardiomediastinal silhouette unremarkable and unchanged. Mildly prominent bronchovascular markings diffusely and mild central peribronchial thickening, more so than on the remote examination in 2011. Lungs otherwise clear. No localized airspace consolidation. No pleural  effusions. No pneumothorax. Normal pulmonary vascularity. Visualized bony thorax intact. IMPRESSION: Mild changes of acute bronchitis and/or asthma without focal airspace pneumonia. Electronically Signed   By: Hulan Saashomas  Lawrence M.D.   On: 12/04/2018 20:38   Dg Lumbar Spine 2-3 Views  Result Date: 12/04/2018 CLINICAL DATA:  Acute onset of low back pain and dysuria that began yesterday. EXAM: LUMBAR SPINE - 2-3 VIEW COMPARISON:  None. FINDINGS: Five non-rib-bearing lumbar vertebrae with anatomic alignment. Straightening of the usual lumbar lordosis. No fractures. Well-preserved disc spaces. No POSTERIOR element hypertrophy. Sacroiliac joints intact. IMPRESSION: Straightening of the usual lordosis which may reflect positioning and/or spasm. Otherwise normal examination. Electronically Signed   By: Hulan Saashomas  Lawrence M.D.   On: 12/04/2018 20:40   Dg Abdomen 1 View  Result Date: 12/04/2018 CLINICAL DATA:  Acute onset of low back pain and dysuria that began yesterday. EXAM: ABDOMEN - 1 VIEW COMPARISON:  None. FINDINGS: Bowel gas pattern unremarkable without evidence of obstruction or significant ileus. Moderate stool burden in the colon. No visible opaque urinary tract calculi. IMPRESSION: No acute abdominal abnormality. Moderate colonic stool burden. Electronically Signed   By: Hulan Saashomas  Lawrence  M.D.   On: 12/04/2018 20:41   Ct Abdomen Pelvis W Contrast  Result Date: 12/04/2018 CLINICAL DATA:  Low back pain since yesterday. EXAM: CT ABDOMEN AND PELVIS WITH CONTRAST TECHNIQUE: Multidetector CT imaging of the abdomen and pelvis was performed using the standard protocol following bolus administration of intravenous contrast. CONTRAST:  100mL OMNIPAQUE IOHEXOL 300 MG/ML  SOLN COMPARISON:  Lumbar spine radiographs 12/04/2018 FINDINGS: Lower chest: No acute abnormality. Hepatobiliary: No focal liver abnormality is seen. No gallstones, gallbladder wall thickening, or biliary dilatation. Pancreas: Unremarkable. No pancreatic ductal dilatation or surrounding inflammatory changes. Spleen: Normal in size without focal abnormality. Adrenals/Urinary Tract: Normal bilateral adrenal glands symmetric cortical enhancement of both kidneys obstructive uropathy. No hydroureteronephrosis. The urinary bladder is unremarkable for the degree of distention. Stomach/Bowel: Stomach is within normal limits. Appendix appears normal. No evidence of bowel wall thickening, distention, or inflammatory changes. Vascular/Lymphatic: No significant vascular findings are present. No enlarged abdominal or pelvic lymph nodes. Reproductive: Involuting cyst or corpus luteum in right ovary measuring 1.8 cm. Other: Small periumbilical fat containing hernia. No free air or free fluid. Musculoskeletal: No acute or significant osseous findings. IMPRESSION: 1. Involuting cyst or corpus luteum in right ovary measuring 1.8 cm. 2. Small periumbilical fat containing hernia. 3. No acute bowel inflammation or obstruction. Electronically Signed   By: Tollie Ethavid  Kwon M.D.   On: 12/04/2018 21:53    Procedures Procedures (including critical care time)  Medications Ordered in ED Medications  iopamidol (ISOVUE-300) 61 % injection 30 mL (has no administration in time range)  cyclobenzaprine (FLEXERIL) tablet 10 mg (has no administration in time range)    acetaminophen (TYLENOL) tablet 1,000 mg (1,000 mg Oral Given 12/04/18 1950)  oxyCODONE-acetaminophen (PERCOCET/ROXICET) 5-325 MG per tablet 1 tablet (1 tablet Oral Given 12/04/18 2119)  iohexol (OMNIPAQUE) 300 MG/ML solution 100 mL (100 mLs Intravenous Contrast Given 12/04/18 2126)     Initial Impression / Assessment and Plan / ED Course  I have reviewed the triage vital signs and the nursing notes.  Pertinent labs & imaging results that were available during my care of the patient were reviewed by me and considered in my medical decision making (see chart for details).     32 year old female with complaints of lower back pain and urinary symptoms.  Urine pregnancy test negative, urinalysis negative for any acute  infection or blood.  On exam noted to have some mild abdominal tenderness.  X-rays obtained of the chest, lumbar spine and kidneys ureters and bladder showed no stones, fractures or pulmonary infiltrates.  CT abdomen pelvis obtained showing no acute intra-abdominal process.  Patient placed on a muscle relaxer for tightness in the lower back.  She understands signs symptoms return to the ED for.  Vital signs are stable.  Final Clinical Impressions(s) / ED Diagnoses   Final diagnoses:  Acute bilateral low back pain without sciatica  Muscle tightness    ED Discharge Orders         Ordered    cyclobenzaprine (FLEXERIL) 5 MG tablet  3 times daily PRN     12/04/18 2228           Ronnette Juniper 12/04/18 2234    Phineas Semen, MD 12/04/18 2311

## 2018-12-04 NOTE — ED Notes (Signed)
Patient transported to CT 

## 2018-12-04 NOTE — ED Notes (Signed)
See triage note. Pt sitting in bed resting. Family at bedside. Pt c/o remaining "fluid in ears."

## 2018-12-04 NOTE — Discharge Instructions (Addendum)
Please take muscle relaxer as prescribed.  Apply heating pad to lower back.  If any increasing pain fevers or worsening symptoms or change in health return to the ED.

## 2018-12-04 NOTE — ED Triage Notes (Signed)
Patient presents to the ED with lower back pain since yesterday and difficulty urinating.  Patient states she was recently seen for an upper respiratory and hasn't fully recovered from that.  Patient was put on antibiotics and states, "I just wanted to make sure it wasn't the medicine making my back hurt."  Patient is in no obvious distress at this time.  Denies unilateral back pain.

## 2019-05-16 ENCOUNTER — Encounter: Payer: Self-pay | Admitting: Emergency Medicine

## 2019-05-16 ENCOUNTER — Other Ambulatory Visit: Payer: Self-pay

## 2019-05-16 DIAGNOSIS — R1110 Vomiting, unspecified: Secondary | ICD-10-CM | POA: Insufficient documentation

## 2019-05-16 DIAGNOSIS — Z5321 Procedure and treatment not carried out due to patient leaving prior to being seen by health care provider: Secondary | ICD-10-CM | POA: Insufficient documentation

## 2019-05-16 LAB — URINALYSIS, COMPLETE (UACMP) WITH MICROSCOPIC
Bacteria, UA: NONE SEEN
Bilirubin Urine: NEGATIVE
Glucose, UA: NEGATIVE mg/dL
Ketones, ur: 80 mg/dL — AB
Leukocytes,Ua: NEGATIVE
Nitrite: NEGATIVE
Protein, ur: 30 mg/dL — AB
Specific Gravity, Urine: 1.017 (ref 1.005–1.030)
pH: 6 (ref 5.0–8.0)

## 2019-05-16 LAB — CBC WITH DIFFERENTIAL/PLATELET
Abs Immature Granulocytes: 0.03 10*3/uL (ref 0.00–0.07)
Basophils Absolute: 0.1 10*3/uL (ref 0.0–0.1)
Basophils Relative: 1 %
Eosinophils Absolute: 0.1 10*3/uL (ref 0.0–0.5)
Eosinophils Relative: 1 %
HCT: 44.9 % (ref 36.0–46.0)
Hemoglobin: 15.6 g/dL — ABNORMAL HIGH (ref 12.0–15.0)
Immature Granulocytes: 0 %
Lymphocytes Relative: 11 %
Lymphs Abs: 1.5 10*3/uL (ref 0.7–4.0)
MCH: 30.5 pg (ref 26.0–34.0)
MCHC: 34.7 g/dL (ref 30.0–36.0)
MCV: 87.9 fL (ref 80.0–100.0)
Monocytes Absolute: 0.9 10*3/uL (ref 0.1–1.0)
Monocytes Relative: 7 %
Neutro Abs: 10.4 10*3/uL — ABNORMAL HIGH (ref 1.7–7.7)
Neutrophils Relative %: 80 %
Platelets: 241 10*3/uL (ref 150–400)
RBC: 5.11 MIL/uL (ref 3.87–5.11)
RDW: 12.3 % (ref 11.5–15.5)
WBC: 13 10*3/uL — ABNORMAL HIGH (ref 4.0–10.5)
nRBC: 0 % (ref 0.0–0.2)

## 2019-05-16 LAB — COMPREHENSIVE METABOLIC PANEL
ALT: 27 U/L (ref 0–44)
AST: 24 U/L (ref 15–41)
Albumin: 4.3 g/dL (ref 3.5–5.0)
Alkaline Phosphatase: 64 U/L (ref 38–126)
Anion gap: 11 (ref 5–15)
BUN: 8 mg/dL (ref 6–20)
CO2: 23 mmol/L (ref 22–32)
Calcium: 9.3 mg/dL (ref 8.9–10.3)
Chloride: 103 mmol/L (ref 98–111)
Creatinine, Ser: 0.72 mg/dL (ref 0.44–1.00)
GFR calc Af Amer: >60 mL/min (ref >60)
GFR calc non Af Amer: >60 mL/min (ref >60)
Glucose, Bld: 99 mg/dL (ref 70–99)
Potassium: 3.6 mmol/L (ref 3.5–5.1)
Sodium: 137 mmol/L (ref 135–145)
Total Bilirubin: 0.9 mg/dL (ref 0.3–1.2)
Total Protein: 8.1 g/dL (ref 6.5–8.1)

## 2019-05-16 LAB — POCT PREGNANCY, URINE: Preg Test, Ur: NEGATIVE

## 2019-05-16 LAB — LIPASE, BLOOD: Lipase: 23 U/L (ref 11–51)

## 2019-05-16 NOTE — ED Triage Notes (Addendum)
Pt to triage via w/c, mask in place with no distress noted; pt reports since Friday having N/V with abd cramping

## 2019-05-17 ENCOUNTER — Other Ambulatory Visit: Payer: Self-pay

## 2019-05-17 ENCOUNTER — Emergency Department
Admission: EM | Admit: 2019-05-17 | Discharge: 2019-05-17 | Disposition: A | Discharge status: Home / Self Care | Payer: Self-pay | Attending: Emergency Medicine | Admitting: Emergency Medicine

## 2019-05-17 ENCOUNTER — Encounter: Payer: Self-pay | Admitting: Emergency Medicine

## 2019-05-17 DIAGNOSIS — F17210 Nicotine dependence, cigarettes, uncomplicated: Secondary | ICD-10-CM | POA: Insufficient documentation

## 2019-05-17 DIAGNOSIS — R112 Nausea with vomiting, unspecified: Secondary | ICD-10-CM | POA: Insufficient documentation

## 2019-05-17 DIAGNOSIS — K047 Periapical abscess without sinus: Secondary | ICD-10-CM

## 2019-05-17 DIAGNOSIS — R1013 Epigastric pain: Secondary | ICD-10-CM | POA: Insufficient documentation

## 2019-05-17 DIAGNOSIS — Z79899 Other long term (current) drug therapy: Secondary | ICD-10-CM | POA: Insufficient documentation

## 2019-05-17 DIAGNOSIS — J45909 Unspecified asthma, uncomplicated: Secondary | ICD-10-CM | POA: Insufficient documentation

## 2019-05-17 MED ORDER — ONDANSETRON 4 MG PO TBDP: 4.0000 mg | ORAL_TABLET | Freq: Three times a day (TID) | ORAL | 0 refills | Status: DC | PRN

## 2019-05-17 MED ORDER — PENICILLIN V POTASSIUM 500 MG PO TABS: 500.0000 mg | ORAL_TABLET | Freq: Four times a day (QID) | ORAL | 0 refills | Status: DC

## 2019-05-17 MED ORDER — DICYCLOMINE HCL 20 MG PO TABS: 20.0000 mg | ORAL_TABLET | Freq: Three times a day (TID) | ORAL | 0 refills | Status: DC | PRN

## 2019-05-17 NOTE — Discharge Instructions (Signed)
OPTIONS FOR DENTAL FOLLOW UP CARE ° °[place] [facility] and [facility] - Local Safety Net Dental Clinics °http://www.ncdhhs.gov/dph/oralhealth/services/safetynetclinics.htm °  °[facility] (336-562-3123) ° °[facility] ([phone]) ° °[facility] ([phone] ext [phone]) ° °[facility] ([phone]) ° °[facility] ([phone]) °This clinic caters to the indigent population and is on a lottery system. °Location: °[facility], [address], [place] °Clinic Hours: °Wednesdays from 6pm - 9pm, patients seen by a lottery system. °For dates, call or go to [email or web address].edu/[facility]/patients/Dental-[facility] °Services: °Cleanings, fillings and simple extractions. °Payment Options: °DENTAL WORK IS FREE OF CHARGE. Bring proof of income or support. °Best way to get seen: °Arrive at 5:15 pm - this is a lottery, NOT first come/first serve, so arriving earlier will not increase your chances of being seen. °  °  °[facility] °[phone] °Select option 1 for emergencies °  °Location: °[facility], [address], [place] °Clinic Hours: °No walk-ins accepted - call the day before to schedule an appointment. °Check in times are 9:30 am and 1:30 pm. °Services: °Simple extractions, temporary fillings, pulpectomy/pulp debridement, uncomplicated abscess drainage. °Payment Options: °PAYMENT IS DUE AT THE TIME OF SERVICE.  Fee is usually $100-200, additional surgical procedures (e.g. abscess drainage) may be extra. °Cash, checks, Visa/MasterCard accepted.  Can file Medicaid if patient is covered for dental - patient should call case worker to check. °No discount for [facility] patients. °Best way to get seen: °MUST call the day before and get onto the schedule. Can usually be seen the next 1-2 days. No walk-ins accepted. °  °  °[facility] °[phone] °   °Location: °[facility], [address], [place] °Clinic Hours: °M, W, Th, F 8am or 1:30pm, Tues 9a or 1:30 - first come/first served. °Services: °Simple extractions, temporary fillings, uncomplicated abscess drainage.  You do not need to be an [place] resident. °Payment Options: °PAYMENT IS DUE AT THE TIME OF SERVICE. °Dental insurance, otherwise sliding scale - bring proof of income or support. °Depending on income and treatment needed, cost is usually $50-200. °Best way to get seen: °Arrive early as it is first come/first served. °  °  °[facility] °[phone] °  °Location: °[address] °Clinic Hours: °Mon-Thu 8a-5p °Services: °Most basic dental services including extractions and fillings. °Payment Options: °PAYMENT IS DUE AT THE TIME OF SERVICE. °Sliding scale, up to 50% off - bring proof if income or support. °Medicaid with dental option accepted. °Best way to get seen: °Call to schedule an appointment, can usually be seen within 2 weeks OR they will try to see walk-ins - show up at 8a or 2p (you may have to wait). °  °  °[facility] °[phone] °[place] RESIDENTS ONLY °  °Location: °[facility], [address], [place], [place] [address] °Clinic Hours: By appointment only. °Monday - Thursday 8am-5pm, Friday 8am-12pm °Services: Cleanings, fillings, extractions. °Payment Options: °PAYMENT IS DUE AT THE TIME OF SERVICE. °Cash, Visa or MasterCard. Sliding scale - $30 minimum per service. °Best way to get seen: °Come in to office, complete packet and make an appointment - need proof of income °or support monies for each household member and proof of [place] residence. °Usually takes about a month to get in. °  °  °[facility] °[phone] °  °Location: °[address]   [place] °Clinic Hours: Walk-in Urgent Care Dental Services are offered Monday-Friday  mornings only. °The numbers of emergencies accepted daily is limited to the number of °providers available. °Maximum 15 - Mondays, Wednesdays & Thursdays °Maximum 10 - Tuesdays & Fridays °Services: °You do not need to be a [place] resident to be seen for a dental emergency. °Emergencies are defined as pain, swelling, abnormal bleeding, or dental trauma. Walkins will receive x-rays if needed. °NOTE: Dental cleaning is not an emergency. °Payment Options: °PAYMENT IS DUE AT THE TIME OF SERVICE. °Minimum co-pay is $40.00 for uninsured patients. °Minimum co-pay is $3.00 for Medicaid with dental coverage. °Dental Insurance is accepted and must be presented at time of visit. °Medicare does not cover dental. °Forms of payment: Cash, credit card, checks. °Best way to get seen: °If not previously registered with the clinic, walk-in dental registration begins at 7:15 am and is on a first come/first serve basis. °If previously registered with the clinic, call to make an appointment. °  °  °[facility] °[phone] °[place] RESIDENTS ONLY °  °Location: °[address], [place], [place] °Clinic Hours: °Mon-Thu 10a-2p °Services: Extractions only! °Payment Options: °FREE (donations accepted) - bring proof of income or support °Best way to get seen: °Call and schedule an appointment OR come at 8am on the 1st Monday of every month (except for holidays) when it is first come/first served. °  °  °[facility] °[phone] °  °Location: °[address], [place] °Clinic Hours: °Friday mornings °Services, Payment Options, Best way to get seen: °Call for info °

## 2019-05-17 NOTE — ED Provider Notes (Signed)
[facility] Emergency Department Provider Note  Time seen: 4:57 PM  I have reviewed the triage vital signs and the nursing notes.   HISTORY  Chief Complaint Nausea; Emesis; and Abdominal Pain    HPI Sally Finley is a 33 y.o. female with a past medical history of asthma, presents to the emergency department with multiple complaints.  According to the patient she has been experiencing over the past several weeks pain in her right lower molar where she has tooth decay.  States over the past 4 days she has had intermittent nausea and vomiting with mild epigastric discomfort at times.  Denies any fever cough congestion or shortness of breath.  Does state intermittent loose stool over the past 1 week.  Also states she was bit by a tick several weeks ago, denies any rash.  No fever.  Patient came to the emergency department last night had lab work performed however due to wait time she left prior to being seen.  Past Medical History:  Diagnosis Date  . Asthma     There are no active problems to display for this patient.   History reviewed. No pertinent surgical history.  Prior to Admission medications   Medication Sig Start Date End Date Taking? Authorizing Provider  albuterol (PROVENTIL HFA;VENTOLIN HFA) 108 (90 Base) MCG/ACT inhaler Inhale 2 puffs into the lungs every 6 (six) hours as needed for wheezing or shortness of breath. 12/01/18   Laban Emperor, PA-C  amoxicillin (AMOXIL) 875 MG tablet Take 1 tablet (875 mg total) by mouth 2 (two) times daily. 07/24/18   Harrie Foreman, MD  chlorpheniramine-HYDROcodone [facility] PENNKINETIC ER) 10-8 MG/5ML SUER Take 5 mLs by mouth at bedtime as needed. 04/15/18   Coral Spikes, DO  cyclobenzaprine (FLEXERIL) 5 MG tablet Take 1-2 tablets (5-10 mg total) by mouth 3 (three) times daily as needed for muscle spasms. 12/04/18   Duanne Guess, PA-C  fluticasone (FLONASE) 50 MCG/ACT nasal spray Place 1 spray into both nostrils  daily. 07/24/18   Harrie Foreman, MD  guaiFENesin-dextromethorphan (ROBITUSSIN DM) 100-10 MG/5ML syrup Take 5 mLs by mouth every 4 (four) hours as needed for cough. 12/01/18   Laban Emperor, PA-C  lidocaine (XYLOCAINE) 2 % solution Use as directed 10 mLs in the mouth or throat as needed for mouth pain. 12/01/18   Laban Emperor, PA-C  predniSONE (DELTASONE) 10 MG tablet Take 6 tablets on day 1, take 5 tablets on day 2, take 4 tablets on day 3, take 3 tablets on day 4, take 2 tablets on day 5, take 1 tablet on day 6 12/01/18   Laban Emperor, PA-C    Allergies  Allergen Reactions  . Morphine And Related   . Phenergan [Promethazine Hcl]     No family history on file.  Social History Social History   Tobacco Use  . Smoking status: Current Every Day Smoker    Packs/day: 0.50    Types: Cigarettes  . Smokeless tobacco: Never Used  Substance Use Topics  . Alcohol use: No    Frequency: Never  . Drug use: No    Review of Systems Constitutional: Negative for fever. Cardiovascular: Negative for chest pain. Respiratory: Negative for shortness of breath. Gastrointestinal: Mild epigastric discomfort.  Positive for intermittent nausea vomiting diarrhea x4 5 days. Genitourinary: Negative for urinary compaints Musculoskeletal: Negative for musculoskeletal complaints Skin: Negative for skin complaints  Neurological: Negative for headache All other ROS negative  ____________________________________________   PHYSICAL EXAM:  VITAL SIGNS: ED Triage Vitals  Enc Vitals Group     BP 05/17/19 1400 128/80     Pulse Rate 05/17/19 1400 83     Resp 05/17/19 1400 19     Temp 05/17/19 1400 (!) 97.3 F (36.3 C)     Temp Source 05/17/19 1400 Oral     SpO2 05/17/19 1400 95 %     Weight 05/17/19 1355 170 lb (77.1 kg)     Height 05/17/19 1355 5\' 1"  (1.549 m)     Head Circumference --      Peak Flow --      Pain Score 05/17/19 1355 7     Pain Loc --      Pain Edu? --      Excl. in [place]? --      Constitutional: Alert and oriented. Well appearing and in no distress. Eyes: Normal exam ENT      Head: Normocephalic and atraumatic.      Mouth/Throat: Mucous membranes are moist. Cardiovascular: Normal rate, regular rhythm.  Respiratory: Normal respiratory effort without tachypnea nor retractions. Breath sounds are clear  Gastrointestinal: Soft and nontender. No distention. Musculoskeletal: Nontender with normal range of motion in all extremities.  Neurologic:  Normal speech and language. No gross focal neurologic deficits  Skin:  Skin is warm, dry and intact.  Psychiatric: Mood and affect are normal.  ____________________________________________   INITIAL IMPRESSION / ASSESSMENT AND PLAN / ED COURSE  Pertinent labs & imaging results that were available during my care of the patient were reviewed by me and considered in my medical decision making (see chart for details).   Patient presents emergency department for intermittent nausea vomiting diarrhea, mild epigastric discomfort, right lower molar pain, tick bite several weeks ago.  Overall the patient appears extremely well, minimal epigastric discomfort on exam, no right upper quadrant tenderness.  Abdominal exam largely benign.  Patient's lab work yesterday shows a slight leukocytosis of 13,000 otherwise largely within normal limits/baseline for the patient.  Patient's tooth does show signs of infection although no sign of local abscess or drainable abscess.  Poor dentition overall.  We will cover with antibiotics for possible tooth infection.  We will place the patient on Zofran and Bentyl.  Given her abdominal exam and I do not believe CT imaging is warranted at this time however I did discuss abdominal pain return precautions for worsening abdominal pain or development of fever.  Sally Finley was evaluated in Emergency Department on 05/17/2019 for the symptoms described in the history of present illness. She was evaluated in the  context of the global COVID-19 pandemic, which necessitated consideration that the patient might be at risk for infection with the SARS-CoV-2 virus that causes COVID-19. Institutional protocols and algorithms that pertain to the evaluation of patients at risk for COVID-19 are in a state of rapid change based on information released by regulatory bodies including the CDC and federal and state organizations. These policies and algorithms were followed during the patient's care in the ED.  ____________________________________________   FINAL CLINICAL IMPRESSION(S) / ED DIAGNOSES  Abdominal pain Nausea vomiting   Minna AntisPaduchowski, Shirlee Whitmire, MD 05/17/19 1700

## 2019-05-17 NOTE — ED Triage Notes (Signed)
Pt reports NV and abd pain for a few days. Pt states was here last night and had blood work and urine done but she left before being seen. Pt ambulatory in no distress.

## 2019-05-17 NOTE — ED Triage Notes (Signed)
Last vomited last 30 mins PTA

## 2019-08-12 ENCOUNTER — Emergency Department
Admission: EM | Admit: 2019-08-12 | Discharge: 2019-08-12 | Disposition: A | Discharge status: Home / Self Care | Payer: No Typology Code available for payment source | Attending: Emergency Medicine | Admitting: Emergency Medicine

## 2019-08-12 ENCOUNTER — Other Ambulatory Visit: Payer: Self-pay

## 2019-08-12 ENCOUNTER — Emergency Department: Payer: No Typology Code available for payment source

## 2019-08-12 ENCOUNTER — Encounter: Payer: Self-pay | Admitting: Emergency Medicine

## 2019-08-12 DIAGNOSIS — Z79899 Other long term (current) drug therapy: Secondary | ICD-10-CM | POA: Insufficient documentation

## 2019-08-12 DIAGNOSIS — S0990XA Unspecified injury of head, initial encounter: Secondary | ICD-10-CM | POA: Diagnosis present

## 2019-08-12 DIAGNOSIS — S93601A Unspecified sprain of right foot, initial encounter: Secondary | ICD-10-CM

## 2019-08-12 DIAGNOSIS — S39012A Strain of muscle, fascia and tendon of lower back, initial encounter: Secondary | ICD-10-CM | POA: Diagnosis not present

## 2019-08-12 DIAGNOSIS — Y999 Unspecified external cause status: Secondary | ICD-10-CM | POA: Insufficient documentation

## 2019-08-12 DIAGNOSIS — J45909 Unspecified asthma, uncomplicated: Secondary | ICD-10-CM | POA: Diagnosis not present

## 2019-08-12 DIAGNOSIS — Y93I9 Activity, other involving external motion: Secondary | ICD-10-CM | POA: Insufficient documentation

## 2019-08-12 DIAGNOSIS — Y92410 Unspecified street and highway as the place of occurrence of the external cause: Secondary | ICD-10-CM | POA: Insufficient documentation

## 2019-08-12 DIAGNOSIS — S161XXA Strain of muscle, fascia and tendon at neck level, initial encounter: Secondary | ICD-10-CM | POA: Diagnosis not present

## 2019-08-12 DIAGNOSIS — F17210 Nicotine dependence, cigarettes, uncomplicated: Secondary | ICD-10-CM | POA: Diagnosis not present

## 2019-08-12 DIAGNOSIS — S0083XA Contusion of other part of head, initial encounter: Secondary | ICD-10-CM | POA: Diagnosis not present

## 2019-08-12 DIAGNOSIS — S0093XA Contusion of unspecified part of head, initial encounter: Secondary | ICD-10-CM

## 2019-08-12 MED ORDER — MELOXICAM 15 MG PO TABS: 15.0000 mg | ORAL_TABLET | Freq: Every day | ORAL | 2 refills | Status: AC

## 2019-08-12 MED ORDER — METHOCARBAMOL 500 MG PO TABS: 500.0000 mg | ORAL_TABLET | Freq: Four times a day (QID) | ORAL | 0 refills | Status: DC

## 2019-08-12 MED ORDER — IBUPROFEN 800 MG PO TABS
800.0000 mg | ORAL_TABLET | Freq: Once | ORAL | Status: AC
  Administered 2019-08-12: 800 mg via ORAL
  Filled 2019-08-12: qty 1

## 2019-08-12 MED ORDER — ALBUTEROL SULFATE (2.5 MG/3ML) 0.083% IN NEBU
2.5000 mg | INHALATION_SOLUTION | Freq: Once | RESPIRATORY_TRACT | Status: AC
  Administered 2019-08-12: 13:00:00 2.5 mg via RESPIRATORY_TRACT
  Filled 2019-08-12: qty 3

## 2019-08-12 MED ORDER — CYCLOBENZAPRINE HCL 10 MG PO TABS
10.0000 mg | ORAL_TABLET | Freq: Once | ORAL | Status: AC
  Administered 2019-08-12: 10 mg via ORAL
  Filled 2019-08-12: qty 1
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
## 2019-08-12 NOTE — Discharge Instructions (Signed)
Follow-up with [facility] clinic orthopedics if not better in 1 week.  Return emergency department if worsening.  Take your medications as prescribed.  Apply ice to all areas that hurt.  It is normal for you to be sore for several days.
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
## 2019-08-21 ENCOUNTER — Encounter: Payer: Self-pay | Admitting: Emergency Medicine

## 2019-08-21 ENCOUNTER — Ambulatory Visit
Admission: EM | Admit: 2019-08-21 | Discharge: 2019-08-21 | Disposition: A | Discharge status: Home / Self Care | Payer: Self-pay | Attending: Family Medicine | Admitting: Family Medicine

## 2019-08-21 ENCOUNTER — Other Ambulatory Visit: Payer: Self-pay

## 2019-08-21 DIAGNOSIS — J4520 Mild intermittent asthma, uncomplicated: Secondary | ICD-10-CM

## 2019-08-21 DIAGNOSIS — M25561 Pain in right knee: Secondary | ICD-10-CM

## 2019-08-21 DIAGNOSIS — M542 Cervicalgia: Secondary | ICD-10-CM

## 2019-08-21 DIAGNOSIS — M549 Dorsalgia, unspecified: Secondary | ICD-10-CM

## 2019-08-21 DIAGNOSIS — M545 Low back pain: Secondary | ICD-10-CM

## 2019-08-21 DIAGNOSIS — Z760 Encounter for issue of repeat prescription: Secondary | ICD-10-CM

## 2019-08-21 MED ORDER — MELOXICAM 15 MG PO TABS: 15.0000 mg | ORAL_TABLET | Freq: Every day | ORAL | 0 refills | Status: DC | PRN

## 2019-08-21 MED ORDER — ALBUTEROL SULFATE HFA 108 (90 BASE) MCG/ACT IN AERS: 2.0000 | INHALATION_SPRAY | RESPIRATORY_TRACT | 0 refills | Status: DC | PRN

## 2019-08-21 MED ORDER — CYCLOBENZAPRINE HCL 10 MG PO TABS: 10.0000 mg | ORAL_TABLET | Freq: Two times a day (BID) | ORAL | 0 refills | Status: DC | PRN
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
## 2019-08-21 NOTE — Discharge Instructions (Signed)
Take medication as prescribed. Rest. Drink plenty of fluids. Stretch. Ice. Heat.   Follow-up with orthopedic this week as discussed.  Follow up with your primary care physician this week as needed. Return to Urgent care for new or worsening concerns.
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
## 2022-01-10 ENCOUNTER — Inpatient Hospital Stay
Admission: EM | Admit: 2022-01-10 | Discharge: 2022-01-12 | DRG: 871 | Disposition: A | Discharge status: Home / Self Care | Payer: Self-pay | Attending: Internal Medicine | Admitting: Internal Medicine

## 2022-01-10 ENCOUNTER — Other Ambulatory Visit: Payer: Self-pay

## 2022-01-10 ENCOUNTER — Emergency Department: Payer: Self-pay

## 2022-01-10 DIAGNOSIS — A419 Sepsis, unspecified organism: Secondary | ICD-10-CM

## 2022-01-10 DIAGNOSIS — A0811 Acute gastroenteropathy due to Norwalk agent: Secondary | ICD-10-CM | POA: Diagnosis present

## 2022-01-10 DIAGNOSIS — A4189 Other specified sepsis: Principal | ICD-10-CM | POA: Diagnosis present

## 2022-01-10 DIAGNOSIS — E876 Hypokalemia: Secondary | ICD-10-CM | POA: Diagnosis present

## 2022-01-10 DIAGNOSIS — Z885 Allergy status to narcotic agent status: Secondary | ICD-10-CM

## 2022-01-10 DIAGNOSIS — K529 Noninfective gastroenteritis and colitis, unspecified: Principal | ICD-10-CM

## 2022-01-10 DIAGNOSIS — Z20822 Contact with and (suspected) exposure to covid-19: Secondary | ICD-10-CM | POA: Diagnosis present

## 2022-01-10 DIAGNOSIS — E860 Dehydration: Secondary | ICD-10-CM | POA: Diagnosis present

## 2022-01-10 DIAGNOSIS — F17210 Nicotine dependence, cigarettes, uncomplicated: Secondary | ICD-10-CM | POA: Diagnosis present

## 2022-01-10 DIAGNOSIS — R6520 Severe sepsis without septic shock: Secondary | ICD-10-CM | POA: Diagnosis present

## 2022-01-10 DIAGNOSIS — E872 Acidosis, unspecified: Secondary | ICD-10-CM | POA: Diagnosis present

## 2022-01-10 DIAGNOSIS — J4521 Mild intermittent asthma with (acute) exacerbation: Secondary | ICD-10-CM

## 2022-01-10 DIAGNOSIS — J9601 Acute respiratory failure with hypoxia: Secondary | ICD-10-CM | POA: Diagnosis present

## 2022-01-10 DIAGNOSIS — Z888 Allergy status to other drugs, medicaments and biological substances status: Secondary | ICD-10-CM

## 2022-01-10 DIAGNOSIS — Z560 Unemployment, unspecified: Secondary | ICD-10-CM

## 2022-01-10 LAB — CBC WITH DIFFERENTIAL/PLATELET
Abs Immature Granulocytes: 0.09 10*3/uL — ABNORMAL HIGH (ref 0.00–0.07)
Basophils Absolute: 0 10*3/uL (ref 0.0–0.1)
Basophils Relative: 0 %
Eosinophils Absolute: 0.1 10*3/uL (ref 0.0–0.5)
Eosinophils Relative: 0 %
HCT: 45.6 % (ref 36.0–46.0)
Hemoglobin: 15.2 g/dL — ABNORMAL HIGH (ref 12.0–15.0)
Immature Granulocytes: 1 %
Lymphocytes Relative: 2 %
Lymphs Abs: 0.3 10*3/uL — ABNORMAL LOW (ref 0.7–4.0)
MCH: 30.7 pg (ref 26.0–34.0)
MCHC: 33.3 g/dL (ref 30.0–36.0)
MCV: 92.1 fL (ref 80.0–100.0)
Monocytes Absolute: 0.7 10*3/uL (ref 0.1–1.0)
Monocytes Relative: 4 %
Neutro Abs: 15.7 10*3/uL — ABNORMAL HIGH (ref 1.7–7.7)
Neutrophils Relative %: 93 %
Platelets: 215 10*3/uL (ref 150–400)
RBC: 4.95 MIL/uL (ref 3.87–5.11)
RDW: 12.5 % (ref 11.5–15.5)
WBC: 16.8 10*3/uL — ABNORMAL HIGH (ref 4.0–10.5)
nRBC: 0 % (ref 0.0–0.2)

## 2022-01-10 LAB — RESP PANEL BY RT-PCR (FLU A&B, COVID) ARPGX2
Influenza A by PCR: NEGATIVE
Influenza B by PCR: NEGATIVE
SARS Coronavirus 2 by RT PCR: NEGATIVE

## 2022-01-10 LAB — URINALYSIS, ROUTINE W REFLEX MICROSCOPIC
Bilirubin Urine: NEGATIVE
Glucose, UA: NEGATIVE mg/dL
Ketones, ur: 15 mg/dL — AB
Nitrite: NEGATIVE
Specific Gravity, Urine: 1.02 (ref 1.005–1.030)
pH: 8.5 — ABNORMAL HIGH (ref 5.0–8.0)

## 2022-01-10 LAB — COMPREHENSIVE METABOLIC PANEL
ALT: 17 U/L (ref 0–44)
AST: 18 U/L (ref 15–41)
Albumin: 3.9 g/dL (ref 3.5–5.0)
Alkaline Phosphatase: 57 U/L (ref 38–126)
Anion gap: 7 (ref 5–15)
BUN: 12 mg/dL (ref 6–20)
CO2: 24 mmol/L (ref 22–32)
Calcium: 8.9 mg/dL (ref 8.9–10.3)
Chloride: 107 mmol/L (ref 98–111)
Creatinine, Ser: 0.77 mg/dL (ref 0.44–1.00)
GFR, Estimated: >60 mL/min (ref >60)
Glucose, Bld: 129 mg/dL — ABNORMAL HIGH (ref 70–99)
Potassium: 4 mmol/L (ref 3.5–5.1)
Sodium: 138 mmol/L (ref 135–145)
Total Bilirubin: 0.6 mg/dL (ref 0.3–1.2)
Total Protein: 7.4 g/dL (ref 6.5–8.1)

## 2022-01-10 LAB — POC URINE PREG, ED: Preg Test, Ur: NEGATIVE

## 2022-01-10 LAB — TROPONIN I (HIGH SENSITIVITY)
Troponin I (High Sensitivity): 4 ng/L (ref <18)
Troponin I (High Sensitivity): 3 ng/L (ref <18)

## 2022-01-10 LAB — ACETAMINOPHEN LEVEL: Acetaminophen (Tylenol), Serum: <10 ug/mL — ABNORMAL LOW (ref 10–30)

## 2022-01-10 LAB — LIPASE, BLOOD: Lipase: 27 U/L (ref 11–51)

## 2022-01-10 LAB — LACTIC ACID, PLASMA
Lactic Acid, Venous: 3.2 mmol/L (ref 0.5–1.9)
Lactic Acid, Venous: 1.8 mmol/L (ref 0.5–1.9)

## 2022-01-10 LAB — MAGNESIUM: Magnesium: 1.5 mg/dL — ABNORMAL LOW (ref 1.7–2.4)

## 2022-01-10 LAB — PROCALCITONIN: Procalcitonin: 0.11 ng/mL

## 2022-01-10 MED ORDER — ENOXAPARIN SODIUM 40 MG/0.4ML IJ SOSY
0.5000 mg/kg | PREFILLED_SYRINGE | INTRAMUSCULAR | Status: DC
  Administered 2022-01-11 (×2): 37.5 mg via SUBCUTANEOUS
  Filled 2022-01-10 (×2): qty 0.4

## 2022-01-10 MED ORDER — ALBUTEROL SULFATE HFA 108 (90 BASE) MCG/ACT IN AERS: 2.0000 | INHALATION_SPRAY | Freq: Four times a day (QID) | RESPIRATORY_TRACT | 2 refills | Status: DC | PRN

## 2022-01-10 MED ORDER — LACTATED RINGERS IV BOLUS
1000.0000 mL | Freq: Once | INTRAVENOUS | Status: AC
  Administered 2022-01-10: 1000 mL via INTRAVENOUS

## 2022-01-10 MED ORDER — ACETAMINOPHEN 325 MG PO TABS
650.0000 mg | ORAL_TABLET | Freq: Four times a day (QID) | ORAL | Status: DC | PRN
  Administered 2022-01-11 – 2022-01-12 (×2): 650 mg via ORAL
  Filled 2022-01-10 (×2): qty 2

## 2022-01-10 MED ORDER — ACETAMINOPHEN 650 MG RE SUPP: 650.0000 mg | Freq: Four times a day (QID) | RECTAL | Status: DC | PRN

## 2022-01-10 MED ORDER — METHYLPREDNISOLONE SODIUM SUCC 125 MG IJ SOLR
125.0000 mg | Freq: Once | INTRAMUSCULAR | Status: AC
  Administered 2022-01-10: 125 mg via INTRAVENOUS
  Filled 2022-01-10: qty 2

## 2022-01-10 MED ORDER — PREDNISONE 50 MG PO TABS: 50.0000 mg | ORAL_TABLET | Freq: Every day | ORAL | 0 refills | Status: DC

## 2022-01-10 MED ORDER — LACTATED RINGERS IV SOLN: INTRAVENOUS | Status: DC

## 2022-01-10 MED ORDER — IPRATROPIUM-ALBUTEROL 0.5-2.5 (3) MG/3ML IN SOLN
3.0000 mL | Freq: Once | RESPIRATORY_TRACT | Status: AC
  Administered 2022-01-10: 3 mL via RESPIRATORY_TRACT
  Filled 2022-01-10: qty 3

## 2022-01-10 MED ORDER — METHYLPREDNISOLONE SODIUM SUCC 40 MG IJ SOLR
40.0000 mg | Freq: Two times a day (BID) | INTRAMUSCULAR | Status: AC
  Administered 2022-01-11 (×2): 40 mg via INTRAVENOUS
  Filled 2022-01-10 (×2): qty 1

## 2022-01-10 MED ORDER — LORAZEPAM 2 MG/ML IJ SOLN
1.0000 mg | Freq: Once | INTRAMUSCULAR | Status: AC
Start: 2022-01-10 — End: 2022-01-10
  Administered 2022-01-10: 1 mg via INTRAVENOUS
  Filled 2022-01-10: qty 1

## 2022-01-10 MED ORDER — ALBUTEROL SULFATE (2.5 MG/3ML) 0.083% IN NEBU
2.5000 mg | INHALATION_SOLUTION | Freq: Four times a day (QID) | RESPIRATORY_TRACT | Status: DC
  Administered 2022-01-11 (×4): 2.5 mg via RESPIRATORY_TRACT
  Filled 2022-01-10 (×4): qty 3

## 2022-01-10 MED ORDER — DROPERIDOL 2.5 MG/ML IJ SOLN
2.5000 mg | Freq: Once | INTRAMUSCULAR | Status: AC
Start: 2022-01-10 — End: 2022-01-10
  Administered 2022-01-10: 2.5 mg via INTRAVENOUS
  Filled 2022-01-10: qty 2

## 2022-01-10 MED ORDER — ONDANSETRON HCL 4 MG/2ML IJ SOLN: 4.0000 mg | Freq: Four times a day (QID) | INTRAMUSCULAR | Status: DC | PRN

## 2022-01-10 MED ORDER — ALBUTEROL SULFATE (2.5 MG/3ML) 0.083% IN NEBU: 2.5000 mg | INHALATION_SOLUTION | RESPIRATORY_TRACT | Status: DC | PRN

## 2022-01-10 MED ORDER — ONDANSETRON 4 MG PO TBDP: 4.0000 mg | ORAL_TABLET | Freq: Three times a day (TID) | ORAL | 0 refills | Status: AC | PRN

## 2022-01-10 MED ORDER — IOHEXOL 300 MG/ML  SOLN
100.0000 mL | Freq: Once | INTRAMUSCULAR | Status: AC | PRN
  Administered 2022-01-10: 100 mL via INTRAVENOUS

## 2022-01-10 MED ORDER — IPRATROPIUM-ALBUTEROL 0.5-2.5 (3) MG/3ML IN SOLN
6.0000 mL | Freq: Once | RESPIRATORY_TRACT | Status: AC
  Administered 2022-01-10: 6 mL via RESPIRATORY_TRACT
  Filled 2022-01-10: qty 6

## 2022-01-10 MED ORDER — ALBUTEROL SULFATE HFA 108 (90 BASE) MCG/ACT IN AERS
1.0000 | INHALATION_SPRAY | Freq: Once | RESPIRATORY_TRACT | Status: AC
  Administered 2022-01-10: 1 via RESPIRATORY_TRACT
  Filled 2022-01-10: qty 6.7

## 2022-01-10 MED ORDER — PREDNISONE 20 MG PO TABS
40.0000 mg | ORAL_TABLET | Freq: Every day | ORAL | Status: DC
  Administered 2022-01-12: 40 mg via ORAL
  Filled 2022-01-10: qty 2

## 2022-01-10 MED ORDER — ONDANSETRON HCL 4 MG PO TABS: 4.0000 mg | ORAL_TABLET | Freq: Four times a day (QID) | ORAL | Status: DC | PRN

## 2022-01-10 NOTE — ED Notes (Addendum)
Pt anxious; provider notified she is requesting something to help calm her down. Will give if ordered once finished with bloodwork. Wife and mom remain at bedside with pt. Mother states pt has condition in which if she gets too anxious her arms and face go "rock hard"; explained something similar can also happen if pt hyperventilates too long.

## 2022-01-10 NOTE — Progress Notes (Signed)
Anticoagulation monitoring(Lovenox):  36 yo female ordered Lovenox 40 mg Q24h    Filed Weights   01/10/22 1524  Weight: 77.1 kg (170 lb)   BMI 33.2    Lab Results  Component Value Date   CREATININE 0.77 01/10/2022   CREATININE 0.72 05/16/2019   CREATININE 0.92 12/04/2018   Estimated Creatinine Clearance: 90.9 mL/min (by C-G formula based on SCr of 0.77 mg/dL). Hemoglobin & Hematocrit     Component Value Date/Time   HGB 15.2 (H) 01/10/2022 1534   HCT 45.6 01/10/2022 1534     Per Protocol for Patient with estCrcl > 30 ml/min and BMI > 30, will transition to Lovenox 37.5 mg Q24h.

## 2022-01-10 NOTE — Discharge Instructions (Addendum)
Please take Tylenol and ibuprofen/Advil for your pain.  It is safe to take them together, or to alternate them every few hours.  Take up to 1000mg  of Tylenol at a time, up to 4 times per day.  Do not take more than 4000 mg of Tylenol in 24 hours.  For ibuprofen, take 400-600 mg, 4-5 times per day.  Use Zofran as needed for nausea and vomiting.  We gave you a dose of steroids for today while in the emergency room, please start the prednisone tomorrow (Sunday) and use this once daily for the next 4 days to finish a 5-day course of steroids to help relax your lungs.   Use albuterol inhaler as needed every 4-6 hours for wheezing, coughing and short of breath.  If developing worsening symptoms despite these measures, please return to the ED.

## 2022-01-10 NOTE — ED Notes (Signed)
Pts SpO2 reading 88% with good pleth. Pt put on 2L of oxygen via [place]. SpO2 improved to 97% on 2L [place].

## 2022-01-10 NOTE — ED Notes (Signed)
Pt assisted to change into gown and get cleaned up as had BM in bed.

## 2022-01-10 NOTE — Assessment & Plan Note (Signed)
Supplemental O2 to keep sats over 92% and wean as tolerated

## 2022-01-10 NOTE — ED Provider Notes (Signed)
Sally Finley Provider Note    Event Date/Time   First MD Initiated Contact with Patient 01/10/22 1522     (approximate)   History   No chief complaint on file.   HPI  Sally Finley is a 36 y.o. female who presents to the ED for evaluation of No chief complaint on file.   I review [facility] ED visit from 1/25 for possible syncope. Hx intermittent asthma.   Patient presents to the evaluation nausea, vomiting and diarrhea today, superimposed on 2 to 3 days of shortness of breath and productive cough.  She reports running out of her inhaler a few weeks to months ago, and for past few days has felt increased shortness of breath, wheezing and chest tightness diffusely across her chest.  Denies fevers, falls or syncope.  This morning, she has had painful episodes, watery diarrhea and nonbloody nonbilious emesis.  Reports some mild lower abdominal pain, slight discomfort denies any focal abdominal pain.  Denies dysuria, increased vaginal discharge or bleeding.  Denies fever, hematochezia and melena.  Physical Exam   Triage Vital Signs: ED Triage Vitals  Enc Vitals Group     BP      Pulse      Resp      Temp      Temp src      SpO2      Weight      Height      Head Circumference      Peak Flow      Pain Score      Pain Loc      Pain Edu?      Excl. in [place]?     Most recent vital signs: Vitals:   01/10/22 2130 01/10/22 2230  BP: 103/67 109/71  Pulse: (!) 109 (!) 106  Resp: 17 (!) 22  Temp:  98.7 F (37.1 C)  SpO2: 99% 98%    General: Awake, no distress.  Obese.  Appears uncomfortable. CV:  Good peripheral perfusion.  Tachycardic and regular. Resp:  Tachypneic to the mid 20s.  Diffuse expiratory wheezes and decreased airflow throughout. Abd:  No distention.  Minimal and diffuse tenderness without localizing features, guarding or peritoneal features. MSK:  No deformity noted.  Neuro:  No focal deficits appreciated. Other:     ED Results  / Procedures / Treatments   Labs (all labs ordered are listed, but only abnormal results are displayed) Labs Reviewed  COMPREHENSIVE METABOLIC PANEL - Abnormal; Notable for the following components:      Result Value   Glucose, Bld 129 (*)    All other components within normal limits  CBC WITH DIFFERENTIAL/PLATELET - Abnormal; Notable for the following components:   WBC 16.8 (*)    Hemoglobin 15.2 (*)    Neutro Abs 15.7 (*)    Lymphs Abs 0.3 (*)    Abs Immature Granulocytes 0.09 (*)    All other components within normal limits  MAGNESIUM - Abnormal; Notable for the following components:   Magnesium 1.5 (*)    All other components within normal limits  URINALYSIS, ROUTINE W REFLEX MICROSCOPIC - Abnormal; Notable for the following components:   pH 8.5 (*)    Hgb urine dipstick MODERATE (*)    Ketones, ur 15 (*)    Protein, ur TRACE (*)    Leukocytes,Ua SMALL (*)    Bacteria, UA RARE (*)    All other components within normal limits  LACTIC ACID, PLASMA - Abnormal;  Notable for the following components:   Lactic Acid, Venous 3.2 (*)    All other components within normal limits  ACETAMINOPHEN LEVEL - Abnormal; Notable for the following components:   Acetaminophen (Tylenol), Serum <10 (*)    All other components within normal limits  RESP PANEL BY RT-PCR (FLU A&B, COVID) ARPGX2  CULTURE, BLOOD (ROUTINE X 2)  CULTURE, BLOOD (ROUTINE X 2)  URINE CULTURE  GASTROINTESTINAL PANEL BY PCR, STOOL (REPLACES STOOL CULTURE)  C DIFFICILE QUICK SCREEN W PCR REFLEX    LIPASE, BLOOD  LACTIC ACID, PLASMA  PROCALCITONIN  PROCALCITONIN  HIV ANTIBODY (ROUTINE TESTING W REFLEX)  CBC  CREATININE, SERUM  POC URINE PREG, ED  TROPONIN I (HIGH SENSITIVITY)  TROPONIN I (HIGH SENSITIVITY)    EKG Sinus rhythm, rate of 93 bpm.  Normal axis and intervals.  Nonspecific T wave changes to inferior and lateral leads.  RADIOLOGY 1 view CXR reviewed by me without evidence of acute cardiopulmonary  pathology. CT abdomen/pelvis reviewed by me without obstructive pathology  Official radiology report(s): CT ABDOMEN PELVIS W CONTRAST  Result Date: 01/10/2022 CLINICAL DATA:  36 year old female with LEFT abdominal and pelvic pain and leukocytosis. EXAM: CT ABDOMEN AND PELVIS WITH CONTRAST TECHNIQUE: Multidetector CT imaging of the abdomen and pelvis was performed using the standard protocol following bolus administration of intravenous contrast. RADIATION DOSE REDUCTION: This exam was performed according to the departmental dose-optimization program which includes automated exposure control, adjustment of the mA and/or kV according to patient size and/or use of iterative reconstruction technique. CONTRAST:  129mL OMNIPAQUE IOHEXOL 300 MG/ML  SOLN COMPARISON:  12/04/2018 CT FINDINGS: Lower chest: Minimal basilar atelectasis noted. Hepatobiliary: The liver and gallbladder are unremarkable. There is no evidence of intrahepatic or extrahepatic biliary dilatation. Pancreas: Unremarkable Spleen: Unremarkable Adrenals/Urinary Tract: The kidneys, adrenal glands and bladder are unremarkable. Stomach/Bowel: There is equivocal mild circumferential wall thickening of a few small bowel loops within the LEFT abdomen. Fluid within nondistended small bowel and colon/rectum are noted. There is no evidence of bowel obstruction. The appendix is normal. Vascular/Lymphatic: No significant vascular findings are present. No enlarged abdominal or pelvic lymph nodes. Reproductive: Uterus and bilateral adnexa are unremarkable. Other: No ascites, focal collection or pneumoperitoneum. Musculoskeletal: No acute or suspicious bony abnormalities are noted. IMPRESSION: 1. Equivocal mild circumferential wall thickening of a few LEFT abdominal small bowel loops which could represent a mild enteritis. Fluid within nondistended small bowel and colon/rectum suggests a diarrheal state. No evidence of bowel obstruction. 2. No other significant  abnormalities. Electronically Signed   By: Margarette Canada M.D.   On: 01/10/2022 19:18   DG Chest Portable 1 View  Result Date: 01/10/2022 CLINICAL DATA:  Asthma exacerbation.  Sepsis EXAM: PORTABLE CHEST 1 VIEW COMPARISON:  12/04/2018 FINDINGS: The heart size and mediastinal contours are within normal limits. Mild chronic bronchitic changes are again noted and appears similar to previous exam. No superimposed airspace consolidation, pleural effusion or interstitial edema. The visualized skeletal structures are unremarkable. IMPRESSION: No active disease. Electronically Signed   By: Kerby Moors M.D.   On: 01/10/2022 16:37    PROCEDURES and INTERVENTIONS:  .1-3 Lead EKG Interpretation Performed by: Vladimir Crofts, MD Authorized by: Vladimir Crofts, MD     Interpretation: abnormal     ECG rate:  101   ECG rate assessment: tachycardic     Rhythm: sinus tachycardia     Ectopy: none     Conduction: normal   .Critical Care Performed by: Vladimir Crofts, MD Authorized  by: Vladimir Crofts, MD   Critical care provider statement:    Critical care time (minutes):  30   Critical care time was exclusive of:  Separately billable procedures and treating other patients   Critical care was necessary to treat or prevent imminent or life-threatening deterioration of the following conditions:  Respiratory failure   Critical care was time spent personally by me on the following activities:  Development of treatment plan with patient or surrogate, discussions with consultants, evaluation of patient's response to treatment, examination of patient, ordering and review of laboratory studies, ordering and review of radiographic studies, ordering and performing treatments and interventions, pulse oximetry, re-evaluation of patient's condition and review of old charts  Medications  enoxaparin (LOVENOX) injection 37.5 mg (has no administration in time range)  acetaminophen (TYLENOL) tablet 650 mg (has no administration in time  range)    Or  acetaminophen (TYLENOL) suppository 650 mg (has no administration in time range)  ondansetron (ZOFRAN) tablet 4 mg (has no administration in time range)    Or  ondansetron (ZOFRAN) injection 4 mg (has no administration in time range)  albuterol (PROVENTIL) (2.5 MG/3ML) 0.083% nebulizer solution 2.5 mg (has no administration in time range)  albuterol (PROVENTIL) (2.5 MG/3ML) 0.083% nebulizer solution 2.5 mg (has no administration in time range)  lactated ringers infusion (has no administration in time range)  methylPREDNISolone sodium succinate (SOLU-MEDROL) 40 mg/mL injection 40 mg (has no administration in time range)    Followed by  predniSONE (DELTASONE) tablet 40 mg (has no administration in time range)  lactated ringers bolus 1,000 mL (0 mLs Intravenous Stopped 01/10/22 1922)  ipratropium-albuterol (DUONEB) 0.5-2.5 (3) MG/3ML nebulizer solution 6 mL (6 mLs Nebulization Given 01/10/22 1602)  methylPREDNISolone sodium succinate (SOLU-MEDROL) 125 mg/2 mL injection 125 mg (125 mg Intravenous Given 01/10/22 1558)  droperidol (INAPSINE) 2.5 MG/ML injection 2.5 mg (2.5 mg Intravenous Given 01/10/22 1649)  lactated ringers bolus 1,000 mL (0 mLs Intravenous Stopped 01/10/22 1922)  LORazepam (ATIVAN) injection 1 mg (1 mg Intravenous Given 01/10/22 1730)  iohexol (OMNIPAQUE) 300 MG/ML solution 100 mL (100 mLs Intravenous Contrast Given 01/10/22 1853)  albuterol (VENTOLIN HFA) 108 (90 Base) MCG/ACT inhaler 1 puff (1 puff Inhalation Given 01/10/22 2040)  lactated ringers bolus 1,000 mL (0 mLs Intravenous Stopped 01/10/22 2238)  ipratropium-albuterol (DUONEB) 0.5-2.5 (3) MG/3ML nebulizer solution 3 mL (3 mLs Nebulization Given 01/10/22 2113)     IMPRESSION / MDM / [place] / ED COURSE  I reviewed the triage vital signs and the nursing notes.  36 year old female presents to the ED with 1 day of nausea, emesis and diarrhea superimposed on few days of shortness of breath, with evidence of  dehydration from gastroenteritis this was an asthma exacerbation, ultimately requiring medical observation admission.  She was initially hypoxic to the mid 80s on room air requiring 2 L nasal cannula, and this transiently resolves after breathing treatments, but she seems to clinically degree-worsen with recurrence of hypoxia, tachypnea and tachycardia during her ED stay, again requiring nasal cannula.  Her abdominal exam is fairly benign without localizing or peritoneal features.  Blood work with leukocytosis, and reassuring metabolic panel.  No signs of pancreatitis.  Her lactic acid is elevated, resolving with IV fluids.  While her heart rate and WBCs are concerning for possibly sepsis, her procalcitonin is fairly low and with her reassuring CT abdomen/pelvis I do not believe she requires antibiotics at this time.  Due to her recurring vital sign derangements and continued discomfort, we  will consult with medicine for admission.  She has no recent antibiotics and no megacolon on CT, less likely to represent C. difficile.  Clinical Course as of 01/10/22 2249  Sat Jan 10, 2022  1650 Reassessed.  Respirations have slowed and wheezing has resolved.  She reports breathing easier, but has persistent loose stools and abdominal discomfort.  We discussed CT of her abdomen and they are agreeable. Family tells me about when patient took "way too much Tylenol, I could overdose level" 2 weeks ago.  Over the course of 1 or 2 days, patient reportedly took 20 extra strength 5 mg tablets of Tylenol because of a really bad menstrual period, since that time she has been taking occasional Tylenol doses but no other large doses such as that. [DS]  1929 Reassessed.  Patient looks better.  Wife and mother say that she is much better now.  Tachycardia has resolved and she is now off of supplemental oxygen.  We discussed work-up so far and the possibilities of etiologies.  We discussed focal area of inflammation on her CT, we  discussed possible etiologies of colitis/enteritis.  We discussed asthma exacerbation.  We discussed disposition and they are requesting discharge.  We discussed further period of observation to repeat lactic acid, p.o. challenge and the possibility of going home.  They are in agreement. [DS]  2103 Reassessed.  Tachycardic and tachypneic again.  I discussed my concerns with going home and they acknowledge that she feels better she is not back to normal, we discussed medical admission and they are agreeable. [DS]    Clinical Course User Index [DS] Vladimir Crofts, MD     FINAL CLINICAL IMPRESSION(S) / ED DIAGNOSES   Final diagnoses:  Gastroenteritis  Mild intermittent asthma with exacerbation  Dehydration     Rx / DC Orders   ED Discharge Orders          Ordered    predniSONE (DELTASONE) 50 MG tablet  Daily        01/10/22 1951    albuterol (VENTOLIN HFA) 108 (90 Base) MCG/ACT inhaler  Every 6 hours PRN        01/10/22 1951    ondansetron (ZOFRAN-ODT) 4 MG disintegrating tablet  Every 8 hours PRN        01/10/22 1951             Note:  This document was prepared using Dragon voice recognition software and may include unintentional dictation errors.   Vladimir Crofts, MD 01/10/22 2252

## 2022-01-10 NOTE — ED Notes (Signed)
Adaline Sill notified of lactic 3.2.

## 2022-01-10 NOTE — Assessment & Plan Note (Signed)
Aggressive IV resuscitation IV antiemetics and supportive care Clear liquid diet Follow GI panel

## 2022-01-10 NOTE — H&P (Signed)
History and Physical    Patient: Sally Finley YQI:[id number] DOB: [birth year] DOA: 01/10/2022 DOS: the patient was seen and examined on 01/10/2022 PCP: Patient, No Pcp Per (Inactive)  Patient coming from: Home  Chief Complaint: No chief complaint on file.   HPI: Sally Finley is a 36 y.o. female with medical history significant of Asthma who presents to the ED with a 3-day history of shortness of breath, cough and wheezing associated with chest tightness with development on the day of arrival of nausea, vomiting and diarrhea.  She denies fever or chills and denies abdominal pain.  Denies dysuria.  Vomiting is nonbloody and nonbilious.  Diarrhea is watery and nonbloody.  ED course: Tmax 99.8 with pulse 116 and respirations 21-25.  Lowest BP in the ED 84/66, improving to 103/67 with IV hydration.  O2 sat 87% on room air on arrival improving to 99% on 2 L  Blood work: WBC 16,000 with lactic acid 3.2-1.8 Creatinine 0.77, lipase and LFTs within normal limits Troponin 3-4, pregnancy test negative Urinalysis with small leukocyte esterase COVID and flu negative GI panel pending  EKG, personally reviewed and interpreted: Sinus tachycardia at 113 with no acute ST-T wave changes  Imaging: CT abdomen and pelvis Mild enteritis and diarrheal state Please refer to complete report Chest x-ray no active disease but mild chronic bronchitic changes seen similar to previous exam  Patient given IV fluid bolus, IV antiemetics.  Also treated with DuoNebs.  Hospitalist consulted for admission.   Review of Systems: As mentioned in the history of present illness. All other systems reviewed and are negative. Past Medical History:  Diagnosis Date   Asthma    History reviewed. No pertinent surgical history. Social History:  reports that she has been smoking cigarettes. She has been smoking an average of .5 packs per day. She has never used smokeless tobacco. She reports current drug use. Drug: Marijuana. She  reports that she does not drink alcohol.  Allergies  Allergen Reactions   Morphine And Related    Phenergan [Promethazine Hcl]     Family History  Problem Relation Age of Onset   Multiple sclerosis Mother     Prior to Admission medications   Medication Sig Start Date End Date Taking? Authorizing Provider  albuterol (VENTOLIN HFA) 108 (90 Base) MCG/ACT inhaler Inhale 2 puffs into the lungs every 6 (six) hours as needed for wheezing or shortness of breath. 01/10/22  Yes Delton Prairie, MD  ondansetron (ZOFRAN-ODT) 4 MG disintegrating tablet Take 1 tablet (4 mg total) by mouth every 8 (eight) hours as needed. 01/10/22  Yes Delton Prairie, MD  predniSONE (DELTASONE) 50 MG tablet Take 1 tablet (50 mg total) by mouth daily for 4 days. 01/10/22 01/14/22 Yes Delton Prairie, MD  albuterol (VENTOLIN HFA) 108 (90 Base) MCG/ACT inhaler Inhale 2 puffs into the lungs every 4 (four) hours as needed for wheezing. Patient not taking: Reported on 01/10/2022 08/21/19   Renford Dills, NP  cyclobenzaprine (FLEXERIL) 10 MG tablet Take 1 tablet (10 mg total) by mouth 2 (two) times daily as needed for muscle spasms. Do not drive while taking as can cause drowsiness Patient not taking: Reported on 01/10/2022 08/21/19   Renford Dills, NP  meloxicam (MOBIC) 15 MG tablet Take 1 tablet (15 mg total) by mouth daily as needed. Patient not taking: Reported on 01/10/2022 08/21/19   Renford Dills, NP  dicyclomine (BENTYL) 20 MG tablet Take 1 tablet (20 mg total) by mouth 3 (three) times daily as needed  for spasms. 05/17/19 08/12/19  Minna Antis, MD  fluticasone (FLONASE) 50 MCG/ACT nasal spray Place 1 spray into both nostrils daily. 07/24/18 08/21/19  Arnaldo Natal, MD    Physical Exam: Vitals:   01/10/22 2045 01/10/22 2100 01/10/22 2109 01/10/22 2130  BP: 100/71 112/73  103/67  Pulse: (!) 110 (!) 110  (!) 109  Resp: (!) 25 (!) 22  17  Temp:      TempSrc:      SpO2: 96% 92% 97% 99%  Weight:      Height:       Physical  Exam Constitutional:      Appearance: She is obese. She is ill-appearing.  HENT:     Head: Normocephalic and atraumatic.  Cardiovascular:     Rate and Rhythm: Tachycardia present.  Pulmonary:     Effort: Tachypnea present.     Breath sounds: Wheezing present.  Abdominal:     General: Bowel sounds are normal.     Palpations: Abdomen is soft.  Musculoskeletal:        General: Normal range of motion.  Skin:    General: Skin is warm and dry.  Neurological:     General: No focal deficit present.     Mental Status: She is oriented to person, place, and time.     Data Reviewed:  ED records including vitals, labs, images, ED providers notes, triage notes, treatment instituted in ED thus far and patient's response to treatment in the ED  Assessment and Plan: * Acute gastroenteritis Aggressive IV resuscitation IV antiemetics and supportive care Clear liquid diet Follow GI panel  Severe sepsis [facility]) Patient with tachycardia and tachypnea, hypoxia and hypotension, leukocytosis and lactic acidosis Continue sepsis fluids Hold off on antibiotics as likely viral infection based on procalcitonin Treat acute gastroenteritis as outlined below  Mild intermittent asthma with acute exacerbation Scheduled and as needed albuterol nebulizer IV steroids Peak flow to assess for improvement Might consider controller medicine going forward  Acute respiratory failure with hypoxia (HCC) Supplemental O2 to keep sats over 92% and wean as tolerated    Advance Care Planning:   Code Status: Full Code none  Consults: none  Family Communication: none  Severity of Illness: The appropriate patient status for this patient is OBSERVATION. Observation status is judged to be reasonable and necessary in order to provide the required intensity of service to ensure the patient's safety. The patient's presenting symptoms, physical exam findings, and initial radiographic and laboratory data in the context  of their medical condition is felt to place them at decreased risk for further clinical deterioration. Furthermore, it is anticipated that the patient will be medically stable for discharge from the hospital within 2 midnights of admission.   Author: Andris Baumann, MD 01/10/2022 10:04 PM  For on call review [email or web address].

## 2022-01-10 NOTE — ED Notes (Addendum)
Pt placed on 3L via [place]. Wheezing present. Visitors at bedside. 500cc bolus from EMS finished. Pt aware urine sample needed when possible.

## 2022-01-10 NOTE — Assessment & Plan Note (Addendum)
Patient with tachycardia and tachypnea, hypoxia and hypotension, leukocytosis and lactic acidosis Continue sepsis fluids Hold off on antibiotics as likely viral infection based on procalcitonin Treat acute gastroenteritis as outlined below

## 2022-01-10 NOTE — ED Triage Notes (Signed)
Pt in with N/V/D from home via EMS; PO intake pizza last night; chills, cough, brown sputum x3 days; BG 166; 122/82; 94HR; 97% RA; 18g L ac zofran 1450 by EMS.

## 2022-01-10 NOTE — Assessment & Plan Note (Addendum)
Scheduled and as needed albuterol nebulizer IV steroids Peak flow to assess for improvement Might consider controller medicine going forward

## 2022-01-11 LAB — GASTROINTESTINAL PANEL BY PCR, STOOL (REPLACES STOOL CULTURE)

## 2022-01-11 LAB — CBC
HCT: 38.1 % (ref 36.0–46.0)
Hemoglobin: 13 g/dL (ref 12.0–15.0)
MCH: 30.9 pg (ref 26.0–34.0)
MCHC: 34.1 g/dL (ref 30.0–36.0)
MCV: 90.5 fL (ref 80.0–100.0)
Platelets: 183 10*3/uL (ref 150–400)
RBC: 4.21 MIL/uL (ref 3.87–5.11)
RDW: 12.6 % (ref 11.5–15.5)
WBC: 14.7 10*3/uL — ABNORMAL HIGH (ref 4.0–10.5)
nRBC: 0 % (ref 0.0–0.2)

## 2022-01-11 LAB — BASIC METABOLIC PANEL
Anion gap: 7 (ref 5–15)
BUN: 6 mg/dL (ref 6–20)
CO2: 23 mmol/L (ref 22–32)
Calcium: 8.8 mg/dL — ABNORMAL LOW (ref 8.9–10.3)
Chloride: 108 mmol/L (ref 98–111)
Creatinine, Ser: 0.69 mg/dL (ref 0.44–1.00)
GFR, Estimated: >60 mL/min (ref >60)
Glucose, Bld: 135 mg/dL — ABNORMAL HIGH (ref 70–99)
Potassium: 3.3 mmol/L — ABNORMAL LOW (ref 3.5–5.1)
Sodium: 138 mmol/L (ref 135–145)

## 2022-01-11 LAB — C DIFFICILE QUICK SCREEN W PCR REFLEX
C Diff antigen: NEGATIVE
C Diff interpretation: NOT DETECTED
C Diff toxin: NEGATIVE

## 2022-01-11 LAB — PROCALCITONIN: Procalcitonin: 0.17 ng/mL

## 2022-01-11 LAB — HIV ANTIBODY (ROUTINE TESTING W REFLEX): HIV Screen 4th Generation wRfx: NONREACTIVE

## 2022-01-11 LAB — D-DIMER, QUANTITATIVE: D-Dimer, Quant: 0.41 ug/mL-FEU (ref 0.00–0.50)

## 2022-01-11 LAB — MAGNESIUM: Magnesium: 1.4 mg/dL — ABNORMAL LOW (ref 1.7–2.4)

## 2022-01-11 LAB — LACTIC ACID, PLASMA: Lactic Acid, Venous: 2 mmol/L (ref 0.5–1.9)

## 2022-01-11 LAB — PHOSPHORUS: Phosphorus: 2.6 mg/dL (ref 2.5–4.6)

## 2022-01-11 MED ORDER — NICOTINE 14 MG/24HR TD PT24
14.0000 mg | MEDICATED_PATCH | Freq: Every day | TRANSDERMAL | Status: DC
  Administered 2022-01-11 – 2022-01-12 (×2): 14 mg via TRANSDERMAL
  Filled 2022-01-11 (×2): qty 1

## 2022-01-11 MED ORDER — ALPRAZOLAM 0.25 MG PO TABS
0.2500 mg | ORAL_TABLET | Freq: Two times a day (BID) | ORAL | Status: DC | PRN
  Administered 2022-01-11: 0.25 mg via ORAL
  Filled 2022-01-11: qty 1

## 2022-01-11 MED ORDER — GABAPENTIN 100 MG PO CAPS
100.0000 mg | ORAL_CAPSULE | Freq: Once | ORAL | Status: AC
  Administered 2022-01-11: 100 mg via ORAL
  Filled 2022-01-11: qty 1

## 2022-01-11 MED ORDER — SODIUM CHLORIDE 0.9 % IV SOLN
2.0000 g | INTRAVENOUS | Status: DC
  Administered 2022-01-11 – 2022-01-12 (×2): 2 g via INTRAVENOUS
  Filled 2022-01-11: qty 2
  Filled 2022-01-11: qty 20

## 2022-01-11 MED ORDER — POTASSIUM CHLORIDE 2 MEQ/ML IV SOLN
INTRAVENOUS | Status: DC
  Filled 2022-01-11 (×2): qty 1000

## 2022-01-11 MED ORDER — BLISTEX MEDICATED EX OINT
TOPICAL_OINTMENT | CUTANEOUS | Status: DC | PRN
  Filled 2022-01-11: qty 6.3

## 2022-01-11 MED ORDER — ALBUTEROL SULFATE (2.5 MG/3ML) 0.083% IN NEBU
2.5000 mg | INHALATION_SOLUTION | Freq: Two times a day (BID) | RESPIRATORY_TRACT | Status: DC
  Administered 2022-01-12: 2.5 mg via RESPIRATORY_TRACT
  Filled 2022-01-11: qty 3

## 2022-01-11 MED ORDER — POTASSIUM CHLORIDE CRYS ER 20 MEQ PO TBCR
20.0000 meq | EXTENDED_RELEASE_TABLET | Freq: Two times a day (BID) | ORAL | Status: AC
  Administered 2022-01-11: 20 meq via ORAL
  Filled 2022-01-11 (×2): qty 1

## 2022-01-11 MED ORDER — MAGNESIUM SULFATE 2 GM/50ML IV SOLN
2.0000 g | Freq: Once | INTRAVENOUS | Status: AC
  Administered 2022-01-11: 2 g via INTRAVENOUS
  Filled 2022-01-11: qty 50

## 2022-01-11 MED ORDER — METRONIDAZOLE 500 MG/100ML IV SOLN
500.0000 mg | Freq: Two times a day (BID) | INTRAVENOUS | Status: DC
  Administered 2022-01-11 – 2022-01-12 (×3): 500 mg via INTRAVENOUS
  Filled 2022-01-11 (×4): qty 100

## 2022-01-11 NOTE — Progress Notes (Signed)
Lactic acid of 2. MD [place] Crane made aware.

## 2022-01-11 NOTE — Progress Notes (Signed)
°   01/11/22 0358  Assess: MEWS Score  Temp 97.9 F (36.6 C)  BP 118/75  Pulse Rate (!) 111  Resp 20  Level of Consciousness Alert  SpO2 98 %  O2 Device Nasal Cannula  O2 Flow Rate (L/min) 2 L/min  Assess: MEWS Score  MEWS Temp 0  MEWS Systolic 0  MEWS Pulse 2  MEWS RR 0  MEWS LOC 0  MEWS Score 2  MEWS Score Color Yellow  Assess: if the MEWS score is Yellow or Red  Were vital signs taken at a resting state? Yes  Focused Assessment Change from prior assessment (see assessment flowsheet)  Does the patient meet 2 or more of the SIRS criteria? Yes  Does the patient have a confirmed or suspected source of infection? Yes  Provider and Rapid Response Notified? No  MEWS guidelines implemented *See Row Information* Yes  Treat  MEWS Interventions Escalated (See documentation below)  Pain Scale 0-10  Pain Score 0  Take Vital Signs  Increase Vital Sign Frequency  Yellow: Q 2hr X 2 then Q 4hr X 2, if remains yellow, continue Q 4hrs  Escalate  MEWS: Escalate Yellow: discuss with charge nurse/RN and consider discussing with provider and RRT  Notify: Charge Nurse/RN  Name of Charge Nurse/RN Notified Rosalin Hawking, RN  Date Charge Nurse/RN Notified 01/11/22  Time Charge Nurse/RN Notified 0510  Notify: Provider  Provider Name/Title Sharion Settler, NP  Date Provider Notified 01/11/22  Time Provider Notified [phone]  Notification Type  (Secure chat)  Notification Reason Change in status  Provider response No new orders;Other (Comment) (Continue to monitor)  Date of Provider Response 01/11/22  Time of Provider Response [phone]  Document  Patient Outcome Stabilized after interventions  Progress note created (see row info) Yes  Assess: SIRS CRITERIA  SIRS Temperature  0  SIRS Pulse 1  SIRS Respirations  0  SIRS WBC 0  SIRS Score Sum  1

## 2022-01-11 NOTE — Evaluation (Signed)
Occupational Therapy Evaluation Patient Details Name: Sally Finley MRN: [id number] DOB: 1986-05-15 Today's Date: 01/11/2022   History of Present Illness Pt is a 36 y/o F admitted on 01/10/22 after presenting to the ED with 3 day hx of SOB, cough, wheezing  associated with chest tightness with development of N&V & diarrhea on the day of arrival. Pt is being treated for acute gastroenteritis, severe sepsis, mild intermittent asthma with acute exacerbation. PMH: asthma   Clinical Impression   Pt is able to perform bed mobility, transfers, LB dressing, toileting, ambulation without assistance or LOB. Reports significant improvement in N/V/D. Denies falls history. Pt is IND in fxl mobility at baseline. States she still feels somewhat "weak," but is largely back to baseline level. Pt reports no concerns with mgmt of ADL/IADL tasks post DC. No OT needs identified at this time, will sign off.    Recommendations for follow up therapy are one component of a multi-disciplinary discharge planning process, led by the attending physician.  Recommendations may be updated based on patient status, additional functional criteria and insurance authorization.   Follow Up Recommendations  No OT follow up    Assistance Recommended at Discharge PRN  Patient can return home with the following      Functional Status Assessment  Patient has had a recent decline in their functional status and demonstrates the ability to make significant improvements in function in a reasonable and predictable amount of time.  Equipment Recommendations  None recommended by OT    Recommendations for Other Services       Precautions / Restrictions Precautions Precautions: None Restrictions Weight Bearing Restrictions: No      Mobility Bed Mobility Overal bed mobility: Independent                  Transfers Overall transfer level: Independent Equipment used: None                      Balance Overall balance  assessment: Independent                                         ADL either performed or assessed with clinical judgement   ADL Overall ADL's : Independent;At baseline                                             Vision Patient Visual Report: No change from baseline       Perception     Praxis      Pertinent Vitals/Pain Pain Assessment Pain Assessment: No/denies pain     Hand Dominance     Extremity/Trunk Assessment Upper Extremity Assessment Upper Extremity Assessment: Overall WFL for tasks assessed   Lower Extremity Assessment Lower Extremity Assessment: Overall WFL for tasks assessed   Cervical / Trunk Assessment Cervical / Trunk Assessment: Normal   Communication Communication Communication: No difficulties   Cognition Arousal/Alertness: Awake/alert Behavior During Therapy: WFL for tasks assessed/performed Overall Cognitive Status: Within Functional Limits for tasks assessed                                       General Comments  Pt on room air with SPO2 >  90% throughout, max HR 125 bpm; encouraged pt to ambulate while in hospital    Exercises Other Exercises Other Exercises: Educ re: hydration, impt of OOB mobility, DC recs   Shoulder Instructions      Home Living Family/patient expects to be discharged to:: Private residence Living Arrangements: Spouse/significant other;Other relatives (have 8 children, ages 48-15) Available Help at Discharge: Family;Available 24 hours/day Type of Home: House Home Access: Level entry     Home Layout: Two level;Bed/bath upstairs Alternate Level Stairs-Number of Steps: flight Alternate Level Stairs-Rails: Left;Right           Home Equipment: None          Prior Functioning/Environment Prior Level of Function : Independent/Modified Independent             Mobility Comments: Independent, driving, no use of AD, no falls          OT Problem List:  Decreased activity tolerance;Decreased strength      OT Treatment/Interventions:      OT Goals(Current goals can be found in the care plan section) Acute Rehab OT Goals Patient Stated Goal: to go home OT Goal Formulation: With patient Time For Goal Achievement: 01/25/22  OT Frequency:      Co-evaluation              AM-PAC OT "6 Clicks" Daily Activity     Outcome Measure Help from another person eating meals?: None Help from another person taking care of personal grooming?: None Help from another person toileting, which includes using toliet, bedpan, or urinal?: None Help from another person bathing (including washing, rinsing, drying)?: None Help from another person to put on and taking off regular upper body clothing?: None Help from another person to put on and taking off regular lower body clothing?: None 6 Click Score: 24   End of Session    Activity Tolerance: Patient tolerated treatment well Patient left: in bed;with family/visitor present;with nursing/sitter in room;with call bell/phone within reach  OT Visit Diagnosis: Muscle weakness (generalized) (M62.81)                Time: 4709-6283 OT Time Calculation (min): 8 min Charges:  OT General Charges $OT Visit: 1 Visit OT Evaluation $OT Eval Low Complexity: 1 Low OT Treatments $Self Care/Home Management : 8-22 mins Latina Craver, PhD, MS, OTR/L 01/11/22, 2:58 PM

## 2022-01-11 NOTE — Evaluation (Signed)
Physical Therapy Evaluation Patient Details Name: Sally Finley MRN: [id number] DOB: 1986/03/18 Today's Date: 01/11/2022  History of Present Illness  Pt is a 36 y/o F admitted on 01/10/22 after presenting to the ED with 3 day hx of SOB, cough, wheezing  associated with chest tightness with development of N&V & diarrhea on the day of arrival. Pt is being treated for acute gastroenteritis, severe sepsis, mild intermittent asthma with acute exacerbation. PMH: asthma  Clinical Impression  Pt received in bed with mother present in room. Pt reports prior to admission she was independent without AD. On this date, pt is able to ambulate 1 lap around nurses station independently without & negotiate 12 steps with R rail with mod I.  Pt does not demonstrate any overt LOB nor any acute PT needs at this time. Encouraged pt to ambulate while in hospital & pt agreeable. PT to sign off at this time.      Recommendations for follow up therapy are one component of a multi-disciplinary discharge planning process, led by the attending physician.  Recommendations may be updated based on patient status, additional functional criteria and insurance authorization.  Follow Up Recommendations No PT follow up    Assistance Recommended at Discharge None  Patient can return home with the following       Equipment Recommendations None recommended by PT  Recommendations for Other Services       Functional Status Assessment Patient has not had a recent decline in their functional status     Precautions / Restrictions Precautions Precautions: None Restrictions Weight Bearing Restrictions: No      Mobility  Bed Mobility Overal bed mobility: Modified Independent             General bed mobility comments: HOB elevated    Transfers Overall transfer level: Independent Equipment used: None                    Ambulation/Gait Ambulation/Gait assistance: Independent Gait Distance (Feet): 220  Feet Assistive device: None Gait Pattern/deviations: WFL(Within Functional Limits) Gait velocity: slightly decreased        Stairs Stairs: Yes Stairs assistance: Modified independent (Device/Increase time) Stair Management: One rail Right Number of Stairs: 12 General stair comments: to simulate flight of stairs to 2nd level  Wheelchair Mobility    Modified Rankin (Stroke Patients Only)       Balance Overall balance assessment: Independent                                           Pertinent Vitals/Pain Pain Assessment Pain Assessment: Faces Faces Pain Scale: Hurts a little bit Pain Location: stiffness in BLE Pain Intervention(s): Monitored during session    Home Living Family/patient expects to be discharged to:: Private residence Living Arrangements: Spouse/significant other;Other relatives (lives with wife & her family) Available Help at Discharge: Family;Available 24 hours/day Type of Home: House Home Access: Level entry     Alternate Level Stairs-Number of Steps: flight Home Layout: Two level;Bed/bath upstairs Home Equipment: None      Prior Function Prior Level of Function : Independent/Modified Independent             Mobility Comments: Independent, driving, no use of AD, no falls       Hand Dominance        Extremity/Trunk Assessment   Upper Extremity Assessment Upper Extremity Assessment: Overall  WFL for tasks assessed    Lower Extremity Assessment Lower Extremity Assessment: Overall WFL for tasks assessed    Cervical / Trunk Assessment Cervical / Trunk Assessment: Normal  Communication   Communication: No difficulties  Cognition Arousal/Alertness: Awake/alert Behavior During Therapy: WFL for tasks assessed/performed Overall Cognitive Status: Within Functional Limits for tasks assessed                                          General Comments General comments (skin integrity, edema, etc.): Pt  on room air with SPO2 >90% throughout, max HR 125 bpm; encouraged pt to ambulate while in hospital    Exercises     Assessment/Plan    PT Assessment Patient does not need any further PT services  PT Problem List         PT Treatment Interventions      PT Goals (Current goals can be found in the Care Plan section)  Acute Rehab PT Goals Patient Stated Goal: get better PT Goal Formulation: With patient Time For Goal Achievement: 01/25/22 Potential to Achieve Goals: Good    Frequency       Co-evaluation               AM-PAC PT "6 Clicks" Mobility  Outcome Measure Help needed turning from your back to your side while in a flat bed without using bedrails?: None Help needed moving from lying on your back to sitting on the side of a flat bed without using bedrails?: None Help needed moving to and from a bed to a chair (including a wheelchair)?: None Help needed standing up from a chair using your arms (e.g., wheelchair or bedside chair)?: None Help needed to walk in hospital room?: None Help needed climbing 3-5 steps with a railing? : None 6 Click Score: 24    End of Session   Activity Tolerance: Patient tolerated treatment well Patient left: in chair;with family/visitor present;with nursing/sitter in room Nurse Communication: Mobility status      Time: 1130-1144 PT Time Calculation (min) (ACUTE ONLY): 14 min   Charges:   PT Evaluation $PT Eval Low Complexity: 1 Low          Sally Finley, PT, DPT 01/11/22, 1:42 PM   Sally Finley 01/11/2022, 1:41 PM

## 2022-01-11 NOTE — Progress Notes (Signed)
PROGRESS NOTE  Sally Finley [id number] DOB: November 01, 1986 DOA: 01/10/2022 PCP: Patient, No Pcp Per (Inactive)  HPI/Recap of past 24 hours: Sally Finley is a 36 y.o. female with medical history significant of Asthma who presents to [facility] ED with a 3-day history of shortness of breath, cough and wheezing associated with chest tightness with development on the day of arrival of nausea, vomiting and diarrhea.  Work-up revealed severe sepsis secondary to acute gastroenteritis with leukocytosis 16,000, lactic acidosis 3.2 tachycardia, tachypnea hypotension, hypoxia with O2 saturation in the 80s.  CT abdomen pelvis with contrast showed mild enteritis, no evidence of bowel obstruction.   01/11/2022: Patient was seen and examined at bedside.  Her wife was present in the room.  She had mild tenderness with palpation in the right lower quadrant abdomen.  Her nausea was improved, and her diet was advanced to regular consistency diet.    Repeated lactic acid elevated at 2.0.  We will restart IV fluid hydration and treat with Rocephin and IV Flagyl.  Repeat CBC and lactic acid will be obtained in the morning.  Assessment/Plan: Principal Problem:   Acute gastroenteritis Active Problems:   Acute respiratory failure with hypoxia (HCC)   Severe sepsis (HCC)   Mild intermittent asthma with acute exacerbation  Severe sepsis secondary to acute gastroenteritis/mild enteritis seen on CT scan. Patient with tachycardia and tachypnea, hypoxia and hypotension, leukocytosis and lactic acidosis Continue IV fluid hydration IV antiemetics  IV Rocephin, IV Flagyl for persistent leukocytosis, tachycardia, leukocytosis Advance diet as tolerated.  Hypokalemia in the setting of diarrheal illness Serum potassium 3.3 Repleted  Continue LR KCl 40 mEq at 50 cc/h Repeat BMP in the morning  Hypomagnesemia Serum magnesium 1.4 Repleted intravenously Repeat magnesium level normal    Mild intermittent asthma with acute  exacerbation Scheduled and as needed albuterol nebulizer IV steroids Pulmonary toilet Mobilize as tolerated  Acute respiratory failure with hypoxia (HCC) Supplemental O2 to keep sats over 92% and wean as tolerated Home oxygen evaluation 01/11/2022 for DC planning       Advance Care Planning:   Code Status: Full Code   Consults: none   Family Communication: Wife at bedside     Code Status: Full code    Antimicrobials: Rocephin IV Flagyl  DVT prophylaxis: Subcu Lovenox daily  Status is: Inpatient  Patient requires at least 2 midnights for further evaluation and treatment of present condition.      Objective: Vitals:   01/11/22 0358 01/11/22 0641 01/11/22 0755 01/11/22 1146  BP: 118/75 124/86 133/87 (!) 120/97  Pulse: (!) 111 (!) 102 (!) 107 (!) 107  Resp: 20 20 18 18   Temp: 97.9 F (36.6 C) 97.9 F (36.6 C) 97.9 F (36.6 C) (!) 97.5 F (36.4 C)  TempSrc: Oral Oral Oral   SpO2: 98% 100% 94% 99%  Weight:      Height:        Intake/Output Summary (Last 24 hours) at 01/11/2022 1517 Last data filed at 01/11/2022 0300 Gross per 24 hour  Intake 3164.58 ml  Output --  Net 3164.58 ml   Filed Weights   01/10/22 1524  Weight: 77.1 kg    Exam:  General: 35 y.o. year-old female well developed well nourished in no acute distress.  Alert and oriented x3. Cardiovascular: Regular rate and rhythm with no rubs or gallops.  No thyromegaly or JVD noted.   Respiratory: Mild diffuse wheezing noted bilaterally.  Poor inspiratory effort. Abdomen: Soft tenderness with palpation right lower  quadrant.  Nondistended with normal bowel sounds x4 quadrants. Musculoskeletal: No lower extremity edema.  Skin: No ulcerative lesions noted or rashes, Psychiatry: Mood is appropriate for condition and setting   Data Reviewed: CBC: Recent Labs  Lab 01/10/22 1534 01/11/22 0543  WBC 16.8* 14.7*  NEUTROABS 15.7*  --   HGB 15.2* 13.0  HCT 45.6 38.1  MCV 92.1 90.5  PLT 215 [id number]    Basic Metabolic Panel: Recent Labs  Lab 01/10/22 1534 01/11/22 0543  NA 138 138  K 4.0 3.3*  CL 107 108  CO2 24 23  GLUCOSE 129* 135*  BUN 12 6  CREATININE 0.77 0.69  CALCIUM 8.9 8.8*  MG 1.5* 1.4*  PHOS  --  2.6   GFR: Estimated Creatinine Clearance: 90.9 mL/min (by C-G formula based on SCr of 0.69 mg/dL). Liver Function Tests: Recent Labs  Lab 01/10/22 1534  AST 18  ALT 17  ALKPHOS 57  BILITOT 0.6  PROT 7.4  ALBUMIN 3.9   Recent Labs  Lab 01/10/22 1534  LIPASE 27   No results for input(s): AMMONIA in the last 168 hours. Coagulation Profile: No results for input(s): INR, PROTIME in the last 168 hours. Cardiac Enzymes: No results for input(s): CKTOTAL, CKMB, CKMBINDEX, TROPONINI in the last 168 hours. BNP (last 3 results) No results for input(s): PROBNP in the last 8760 hours. HbA1C: No results for input(s): HGBA1C in the last 72 hours. CBG: No results for input(s): GLUCAP in the last 168 hours. Lipid Profile: No results for input(s): CHOL, HDL, LDLCALC, TRIG, CHOLHDL, LDLDIRECT in the last 72 hours. Thyroid Function Tests: No results for input(s): TSH, T4TOTAL, FREET4, T3FREE, THYROIDAB in the last 72 hours. Anemia Panel: No results for input(s): VITAMINB12, FOLATE, FERRITIN, TIBC, IRON, RETICCTPCT in the last 72 hours. Urine analysis:    Component Value Date/Time   COLORURINE YELLOW 01/10/2022 1534   APPEARANCEUR CLEAR 01/10/2022 1534   LABSPEC 1.020 01/10/2022 1534   PHURINE 8.5 (H) 01/10/2022 1534   GLUCOSEU NEGATIVE 01/10/2022 1534   HGBUR MODERATE (A) 01/10/2022 1534   BILIRUBINUR NEGATIVE 01/10/2022 1534   KETONESUR 15 (A) 01/10/2022 1534   PROTEINUR TRACE (A) 01/10/2022 1534   NITRITE NEGATIVE 01/10/2022 1534   LEUKOCYTESUR SMALL (A) 01/10/2022 1534   Sepsis Labs: @LABRCNTIP (procalcitonin:4,lacticidven:4)  ) Recent Results (from the past 240 hour(s))  Blood Culture (routine x 2)     Status: None (Preliminary result)   Collection  Time: 01/10/22  5:13 PM   Specimen: BLOOD  Result Value Ref Range Status   Specimen Description BLOOD RIGHT ANTECUBITAL  Final   Special Requests   Final    BOTTLES DRAWN AEROBIC AND ANAEROBIC Blood Culture adequate volume   Culture   Final    NO GROWTH < 24 HOURS Performed at [facility], [address] [place], [place] [address]    Report Status PENDING  Incomplete  Blood Culture (routine x 2)     Status: None (Preliminary result)   Collection Time: 01/10/22  5:13 PM   Specimen: BLOOD  Result Value Ref Range Status   Specimen Description BLOOD BLOOD LEFT ARM  Final   Special Requests   Final    BOTTLES DRAWN AEROBIC AND ANAEROBIC Blood Culture adequate volume   Culture   Final    NO GROWTH < 24 HOURS Performed at [facility], [address] [place], [place] [address]    Report Status PENDING  Incomplete  Resp Panel by RT-PCR (Flu A&B, Covid) Nasopharyngeal Swab  Status: None   Collection Time: 01/10/22  5:13 PM   Specimen: Nasopharyngeal Swab; Nasopharyngeal(NP) swabs in vial transport medium  Result Value Ref Range Status   SARS Coronavirus 2 by RT PCR NEGATIVE NEGATIVE Final    Comment: (NOTE) SARS-CoV-2 target nucleic acids are NOT DETECTED.  The SARS-CoV-2 RNA is generally detectable in upper respiratory specimens during the acute phase of infection. The lowest concentration of SARS-CoV-2 viral copies this assay can detect is 138 copies/mL. A negative result does not preclude SARS-Cov-2 infection and should not be used as the sole basis for treatment or other patient management decisions. A negative result may occur with  improper specimen collection/handling, submission of specimen other than nasopharyngeal swab, presence of viral mutation(s) within the areas targeted by this assay, and inadequate number of viral copies(<138 copies/mL). A negative result must be combined with clinical observations, patient history, and  epidemiological information. The expected result is Negative.  Fact Sheet for Patients:  EntrepreneurPulse.com.au  Fact Sheet for Healthcare Providers:  IncredibleEmployment.be  This test is no t yet approved or cleared by the [place] FDA and  has been authorized for detection and/or diagnosis of SARS-CoV-2 by FDA under an Emergency Use Authorization (EUA). This EUA will remain  in effect (meaning this test can be used) for the duration of the COVID-19 declaration under Section 564(b)(1) of the Act, 21 U.S.C.section 360bbb-3(b)(1), unless the authorization is terminated  or revoked sooner.       Influenza A by PCR NEGATIVE NEGATIVE Final   Influenza B by PCR NEGATIVE NEGATIVE Final    Comment: (NOTE) The Xpert Xpress SARS-CoV-2/FLU/RSV plus assay is intended as an aid in the diagnosis of influenza from Nasopharyngeal swab specimens and should not be used as a sole basis for treatment. Nasal washings and aspirates are unacceptable for Xpert Xpress SARS-CoV-2/FLU/RSV testing.  Fact Sheet for Patients: EntrepreneurPulse.com.au  Fact Sheet for Healthcare Providers: IncredibleEmployment.be  This test is not yet approved or cleared by the [place] FDA and has been authorized for detection and/or diagnosis of SARS-CoV-2 by FDA under an Emergency Use Authorization (EUA). This EUA will remain in effect (meaning this test can be used) for the duration of the COVID-19 declaration under Section 564(b)(1) of the Act, 21 U.S.C. section 360bbb-3(b)(1), unless the authorization is terminated or revoked.  Performed at [facility], [address] [place], [place] [address]       Studies: CT ABDOMEN PELVIS W CONTRAST  Result Date: 01/10/2022 CLINICAL DATA:  36 year old female with LEFT abdominal and pelvic pain and leukocytosis. EXAM: CT ABDOMEN AND PELVIS WITH CONTRAST TECHNIQUE: Multidetector  CT imaging of the abdomen and pelvis was performed using the standard protocol following bolus administration of intravenous contrast. RADIATION DOSE REDUCTION: This exam was performed according to the departmental dose-optimization program which includes automated exposure control, adjustment of the mA and/or kV according to patient size and/or use of iterative reconstruction technique. CONTRAST:  120mL OMNIPAQUE IOHEXOL 300 MG/ML  SOLN COMPARISON:  12/04/2018 CT FINDINGS: Lower chest: Minimal basilar atelectasis noted. Hepatobiliary: The liver and gallbladder are unremarkable. There is no evidence of intrahepatic or extrahepatic biliary dilatation. Pancreas: Unremarkable Spleen: Unremarkable Adrenals/Urinary Tract: The kidneys, adrenal glands and bladder are unremarkable. Stomach/Bowel: There is equivocal mild circumferential wall thickening of a few small bowel loops within the LEFT abdomen. Fluid within nondistended small bowel and colon/rectum are noted. There is no evidence of bowel obstruction. The appendix is normal. Vascular/Lymphatic: No significant vascular findings are present. No enlarged  abdominal or pelvic lymph nodes. Reproductive: Uterus and bilateral adnexa are unremarkable. Other: No ascites, focal collection or pneumoperitoneum. Musculoskeletal: No acute or suspicious bony abnormalities are noted. IMPRESSION: 1. Equivocal mild circumferential wall thickening of a few LEFT abdominal small bowel loops which could represent a mild enteritis. Fluid within nondistended small bowel and colon/rectum suggests a diarrheal state. No evidence of bowel obstruction. 2. No other significant abnormalities. Electronically Signed   By: Margarette Canada M.D.   On: 01/10/2022 19:18   DG Chest Portable 1 View  Result Date: 01/10/2022 CLINICAL DATA:  Asthma exacerbation.  Sepsis EXAM: PORTABLE CHEST 1 VIEW COMPARISON:  12/04/2018 FINDINGS: The heart size and mediastinal contours are within normal limits. Mild chronic  bronchitic changes are again noted and appears similar to previous exam. No superimposed airspace consolidation, pleural effusion or interstitial edema. The visualized skeletal structures are unremarkable. IMPRESSION: No active disease. Electronically Signed   By: Kerby Moors M.D.   On: 01/10/2022 16:37    Scheduled Meds:  albuterol  2.5 mg Nebulization Q6H   enoxaparin (LOVENOX) injection  0.5 mg/kg Subcutaneous Q24H   methylPREDNISolone (SOLU-MEDROL) injection  40 mg Intravenous Q12H   Followed by   Derrill Memo ON 01/12/2022] predniSONE  40 mg Oral Q breakfast   nicotine  14 mg Transdermal Daily   potassium chloride  20 mEq Oral BID    Continuous Infusions:  cefTRIAXone (ROCEPHIN)  IV 2 g (01/11/22 1025)   metronidazole 500 mg (01/11/22 1112)     LOS: 0 days     Kayleen Memos, MD [facility] Pager [phone]  If 7PM-7AM, please contact night-coverage [email or web address] Password [email or web address] 01/11/2022, 3:17 PM

## 2022-01-11 NOTE — Progress Notes (Signed)
SATURATION QUALIFICATIONS: (This note is used to comply with regulatory documentation for home oxygen)  Patient Saturations on Room Air at Rest = 99%  Patient Saturations on Room Air while Ambulating =95%   

## 2022-01-12 LAB — BASIC METABOLIC PANEL
Anion gap: 5 (ref 5–15)
BUN: 7 mg/dL (ref 6–20)
CO2: 24 mmol/L (ref 22–32)
Calcium: 8.6 mg/dL — ABNORMAL LOW (ref 8.9–10.3)
Chloride: 110 mmol/L (ref 98–111)
Creatinine, Ser: 0.66 mg/dL (ref 0.44–1.00)
GFR, Estimated: >60 mL/min (ref >60)
Glucose, Bld: 111 mg/dL — ABNORMAL HIGH (ref 70–99)
Potassium: 3.9 mmol/L (ref 3.5–5.1)
Sodium: 139 mmol/L (ref 135–145)

## 2022-01-12 LAB — MAGNESIUM: Magnesium: 2.3 mg/dL (ref 1.7–2.4)

## 2022-01-12 LAB — URINE CULTURE

## 2022-01-12 LAB — CBC
HCT: 35.9 % — ABNORMAL LOW (ref 36.0–46.0)
Hemoglobin: 12.2 g/dL (ref 12.0–15.0)
MCH: 30.7 pg (ref 26.0–34.0)
MCHC: 34 g/dL (ref 30.0–36.0)
MCV: 90.4 fL (ref 80.0–100.0)
Platelets: 180 10*3/uL (ref 150–400)
RBC: 3.97 MIL/uL (ref 3.87–5.11)
RDW: 12.9 % (ref 11.5–15.5)
WBC: 16 10*3/uL — ABNORMAL HIGH (ref 4.0–10.5)
nRBC: 0 % (ref 0.0–0.2)

## 2022-01-12 LAB — PROCALCITONIN: Procalcitonin: <0.1 ng/mL

## 2022-01-12 LAB — PHOSPHORUS: Phosphorus: 3 mg/dL (ref 2.5–4.6)

## 2022-01-12 LAB — FERRITIN: Ferritin: 59 ng/mL (ref 11–307)

## 2022-01-12 LAB — LACTIC ACID, PLASMA: Lactic Acid, Venous: 2.3 mmol/L (ref 0.5–1.9)

## 2022-01-12 MED ORDER — LORAZEPAM 0.5 MG PO TABS
0.5000 mg | ORAL_TABLET | Freq: Four times a day (QID) | ORAL | Status: DC | PRN
  Administered 2022-01-12: 0.5 mg via ORAL
  Filled 2022-01-12: qty 1

## 2022-01-12 MED ORDER — NICOTINE 14 MG/24HR TD PT24: 14.0000 mg | MEDICATED_PATCH | Freq: Every day | TRANSDERMAL | 0 refills | Status: AC

## 2022-01-12 MED ORDER — CEPHALEXIN 500 MG PO CAPS: 500.0000 mg | ORAL_CAPSULE | Freq: Three times a day (TID) | ORAL | 0 refills | Status: AC

## 2022-01-12 MED ORDER — METRONIDAZOLE 500 MG PO TABS
500.0000 mg | ORAL_TABLET | Freq: Two times a day (BID) | ORAL | 0 refills | Status: AC
Start: 2022-01-12 — End: 2022-01-17

## 2022-01-12 MED ORDER — METRONIDAZOLE 500 MG PO TABS: 500.0000 mg | ORAL_TABLET | Freq: Two times a day (BID) | ORAL | Status: DC

## 2022-01-12 MED ORDER — DIPHENHYDRAMINE HCL 25 MG PO CAPS
25.0000 mg | ORAL_CAPSULE | Freq: Once | ORAL | Status: AC
  Administered 2022-01-12: 25 mg via ORAL
  Filled 2022-01-12: qty 1

## 2022-01-12 MED ORDER — CEPHALEXIN 500 MG PO CAPS: 500.0000 mg | ORAL_CAPSULE | Freq: Three times a day (TID) | ORAL | Status: DC

## 2022-01-12 NOTE — Progress Notes (Signed)
Nutrition Brief Note  RD consulted for assessment/ nutritional requirements/ status.   Wt Readings from Last 15 Encounters:  01/10/22 77.1 kg  08/21/19 77.1 kg  05/17/19 77.1 kg  05/16/19 77.1 kg  12/04/18 79.4 kg  12/01/18 81.6 kg  07/24/18 77.1 kg  04/15/18 77.1 kg  01/09/18 79.4 kg  09/24/17 79.4 kg   Sally Finley is a 36 y.o. female with medical history significant of Asthma who presents to the ED with a 3-day history of shortness of breath, cough and wheezing associated with chest tightness with development on the day of arrival of nausea, vomiting and diarrhea.  She denies fever or chills and denies abdominal pain.  Denies dysuria.  Vomiting is nonbloody and nonbilious.  Diarrhea is watery and nonbloody.  Pt admitted with acute gastroenteritis.   Reviewed I/O's: +809 ml x 24 hours and +4 L since admission  Labs reviewed.   Body mass index is 33.2 kg/m. Patient meets criteria for obesity, class II based on current BMI. Obesity is a complex, chronic medical condition that is optimally managed by a multidisciplinary care team. Weight loss is not an ideal goal for an acute inpatient hospitalization. However, if further work-up for obesity is warranted, consider outpatient referral to outpatient bariatric service and/or [facility].    Current diet order is regular, patient is consuming approximately n/a% of meals at this time. Labs and medications reviewed.   No nutrition interventions warranted at this time. If nutrition issues arise, please consult RD.   Levada Schilling, RD, LDN, CDCES Registered Dietitian II Certified Diabetes Care and Education Specialist Please refer to [facility] for RD and/or RD on-call/weekend/after hours pager

## 2022-01-12 NOTE — Consult Note (Signed)
Sally Darby, MD [address]  [place]  [place], [place] [address]  Main: [phone]  Fax: [phone] Pager: [phone]   Consultation  Referring Provider:     No ref. provider found Primary Care Physician:  Patient, No Pcp Per (Inactive) Primary Gastroenterologist: None         Reason for Consultation:     Elevated lactic acid, norovirus gastroenteritis  Date of Admission:  01/10/2022 Date of Consultation:  01/12/2022         HPI:   Sally Finley is a 36 y.o. female known history of asthma, admitted on 2/4 secondary to severe nonbloody diarrhea, nausea and vomiting.  Patient denied any fever, chills or abdominal pain.  Patient reports that her symptoms started after having pizza the night before.  She initially experienced 10-12 watery bowel movements daily.  Patient denies any rectal bleeding, patient underwent stool studies which were positive for norovirus infection.  She had leukocytosis with mildly elevated lactic acid 3.2 on admission.  No evidence of AKI.  She underwent CT abdomen and pelvis which revealed mild enteritis with diarrheal state.  Patient is conservatively managed and she has symptomatically improved.  Patient is no longer experiencing nausea or vomiting.  She reports that her diarrhea is significantly improved.  She had 3-4 small watery bowel movements today.  She had solid meal and tolerated it well.  Her wife is bedside.  Patient wants to go home today  Patient does smoke tobacco and marijuana She is currently unemployed  NSAIDs: None  Antiplts/Anticoagulants/Anti thrombotics: None  GI Procedures: None  Past Medical History:  Diagnosis Date   Asthma     History reviewed. No pertinent surgical history.  Prior to Admission medications   Medication Sig Start Date End Date Taking? Authorizing Provider  albuterol (VENTOLIN HFA) 108 (90 Base) MCG/ACT inhaler Inhale 2 puffs into the lungs every 6 (six) hours as needed for wheezing or shortness  of breath. 01/10/22  Yes Vladimir Crofts, MD  ondansetron (ZOFRAN-ODT) 4 MG disintegrating tablet Take 1 tablet (4 mg total) by mouth every 8 (eight) hours as needed. 01/10/22  Yes Vladimir Crofts, MD  albuterol (VENTOLIN HFA) 108 (90 Base) MCG/ACT inhaler Inhale 2 puffs into the lungs every 4 (four) hours as needed for wheezing. Patient not taking: Reported on 01/10/2022 08/21/19   Marylene Land, NP  cephALEXin (KEFLEX) 500 MG capsule Take 1 capsule (500 mg total) by mouth every 8 (eight) hours for 5 days. 01/12/22 01/17/22  Kayleen Memos, DO  cyclobenzaprine (FLEXERIL) 10 MG tablet Take 1 tablet (10 mg total) by mouth 2 (two) times daily as needed for muscle spasms. Do not drive while taking as can cause drowsiness Patient not taking: Reported on 01/10/2022 08/21/19   Marylene Land, NP  meloxicam (MOBIC) 15 MG tablet Take 1 tablet (15 mg total) by mouth daily as needed. Patient not taking: Reported on 01/10/2022 08/21/19   Marylene Land, NP  metroNIDAZOLE (FLAGYL) 500 MG tablet Take 1 tablet (500 mg total) by mouth every 12 (twelve) hours for 5 days. 01/12/22 01/17/22  Kayleen Memos, DO  nicotine (NICODERM CQ - DOSED IN MG/24 HOURS) 14 mg/24hr patch Place 1 patch (14 mg total) onto the skin daily. 01/13/22   Kayleen Memos, DO  dicyclomine (BENTYL) 20 MG tablet Take 1 tablet (20 mg total) by mouth 3 (three) times daily as needed for spasms. 05/17/19 08/12/19  Harvest Dark, MD  fluticasone (FLONASE) 50 MCG/ACT nasal spray  Place 1 spray into both nostrils daily. 07/24/18 08/21/19  Harrie Foreman, MD    Current Facility-Administered Medications:    acetaminophen (TYLENOL) tablet 650 mg, 650 mg, Oral, Q6H PRN, 650 mg at 01/12/22 0846 **OR** acetaminophen (TYLENOL) suppository 650 mg, 650 mg, Rectal, Q6H PRN, Athena Masse, MD   albuterol (PROVENTIL) (2.5 MG/3ML) 0.083% nebulizer solution 2.5 mg, 2.5 mg, Nebulization, Q2H PRN, Athena Masse, MD   albuterol (PROVENTIL) (2.5 MG/3ML) 0.083% nebulizer solution 2.5  mg, 2.5 mg, Nebulization, BID, Hall, Carole N, DO, 2.5 mg at 01/12/22 0803   [START ON 01/13/2022] cephALEXin (KEFLEX) capsule 500 mg, 500 mg, Oral, Q8H, Hall, Carole N, DO   enoxaparin (LOVENOX) injection 37.5 mg, 0.5 mg/kg, Subcutaneous, Q24H, Judd Gaudier V, MD, 37.5 mg at 01/11/22 2121   lip balm (BLISTEX) ointment, , Topical, PRN, Athena Masse, MD, Given at 01/11/22 1125   LORazepam (ATIVAN) tablet 0.5 mg, 0.5 mg, Oral, Q6H PRN, Irene Pap N, DO, 0.5 mg at 01/12/22 0931   metroNIDAZOLE (FLAGYL) tablet 500 mg, 500 mg, Oral, Q12H, Hall, Carole N, DO   nicotine (NICODERM CQ - dosed in mg/24 hours) patch 14 mg, 14 mg, Transdermal, Daily, Hall, Carole N, DO, 14 mg at 01/12/22 0838   ondansetron (ZOFRAN) tablet 4 mg, 4 mg, Oral, Q6H PRN **OR** ondansetron (ZOFRAN) injection 4 mg, 4 mg, Intravenous, Q6H PRN, Athena Masse, MD  Family History  Problem Relation Age of Onset   Multiple sclerosis Mother      Social History   Tobacco Use   Smoking status: Every Day    Packs/day: 0.50    Types: Cigarettes   Smokeless tobacco: Never  Vaping Use   Vaping Use: Every day  Substance Use Topics   Alcohol use: No   Drug use: Yes    Types: Marijuana    Allergies as of 01/10/2022 - Review Complete 01/10/2022  Allergen Reaction Noted   Morphine and related  09/24/2017   Phenergan [promethazine hcl]  09/24/2017    Review of Systems:    All systems reviewed and negative except where noted in HPI.   Physical Exam:  Vital signs in last 24 hours: Temp:  [97.4 F (36.3 C)-98.3 F (36.8 C)] 98.3 F (36.8 C) (02/06 1523) Pulse Rate:  [79-88] 87 (02/06 1523) Resp:  [18] 18 (02/06 1523) BP: (114-131)/(81-88) 131/88 (02/06 1523) SpO2:  [97 %-99 %] 98 % (02/06 1523) Last BM Date: 01/11/22 General:   Pleasant, cooperative in NAD Head:  Normocephalic and atraumatic. Eyes:   No icterus.   Conjunctiva pink. PERRLA. Ears:  Normal auditory acuity. Neck:  Supple; no masses or  thyroidomegaly Lungs: Respirations even and unlabored. Lungs clear to auscultation bilaterally.   No wheezes, crackles, or rhonchi.  Heart:  Regular rate and rhythm;  Without murmur, clicks, rubs or gallops Abdomen:  Soft, nondistended, nontender. Normal bowel sounds. No appreciable masses or hepatomegaly.  No rebound or guarding.  Rectal:  Not performed. Msk:  Symmetrical without gross deformities.  Strength normal Extremities:  Without edema, cyanosis or clubbing. Neurologic:  Alert and oriented x3;  grossly normal neurologically. Skin:  Intact without significant lesions or rashes. Psych:  Alert and cooperative. Normal affect.  LAB RESULTS: CBC Latest Ref Rng & Units 01/12/2022 01/11/2022 01/10/2022  WBC 4.0 - 10.5 K/uL 16.0(H) 14.7(H) 16.8(H)  Hemoglobin 12.0 - 15.0 g/dL 12.2 13.0 15.2(H)  Hematocrit 36.0 - 46.0 % 35.9(L) 38.1 45.6  Platelets 150 - 400 K/uL 180 183 215  BMET BMP Latest Ref Rng & Units 01/12/2022 01/11/2022 01/10/2022  Glucose 70 - 99 mg/dL 111(H) 135(H) 129(H)  BUN 6 - 20 mg/dL 7 6 12   Creatinine 0.44 - 1.00 mg/dL 0.66 0.69 0.77  Sodium 135 - 145 mmol/L 139 138 138  Potassium 3.5 - 5.1 mmol/L 3.9 3.3(L) 4.0  Chloride 98 - 111 mmol/L 110 108 107  CO2 22 - 32 mmol/L 24 23 24   Calcium 8.9 - 10.3 mg/dL 8.6(L) 8.8(L) 8.9    LFT Hepatic Function Latest Ref Rng & Units 01/10/2022 05/16/2019  Total Protein 6.5 - 8.1 g/dL 7.4 8.1  Albumin 3.5 - 5.0 g/dL 3.9 4.3  AST 15 - 41 U/L 18 24  ALT 0 - 44 U/L 17 27  Alk Phosphatase 38 - 126 U/L 57 64  Total Bilirubin 0.3 - 1.2 mg/dL 0.6 0.9     STUDIES: CT ABDOMEN PELVIS W CONTRAST  Result Date: 01/10/2022 CLINICAL DATA:  36 year old female with LEFT abdominal and pelvic pain and leukocytosis. EXAM: CT ABDOMEN AND PELVIS WITH CONTRAST TECHNIQUE: Multidetector CT imaging of the abdomen and pelvis was performed using the standard protocol following bolus administration of intravenous contrast. RADIATION DOSE REDUCTION: This exam was  performed according to the departmental dose-optimization program which includes automated exposure control, adjustment of the mA and/or kV according to patient size and/or use of iterative reconstruction technique. CONTRAST:  164m OMNIPAQUE IOHEXOL 300 MG/ML  SOLN COMPARISON:  12/04/2018 CT FINDINGS: Lower chest: Minimal basilar atelectasis noted. Hepatobiliary: The liver and gallbladder are unremarkable. There is no evidence of intrahepatic or extrahepatic biliary dilatation. Pancreas: Unremarkable Spleen: Unremarkable Adrenals/Urinary Tract: The kidneys, adrenal glands and bladder are unremarkable. Stomach/Bowel: There is equivocal mild circumferential wall thickening of a few small bowel loops within the LEFT abdomen. Fluid within nondistended small bowel and colon/rectum are noted. There is no evidence of bowel obstruction. The appendix is normal. Vascular/Lymphatic: No significant vascular findings are present. No enlarged abdominal or pelvic lymph nodes. Reproductive: Uterus and bilateral adnexa are unremarkable. Other: No ascites, focal collection or pneumoperitoneum. Musculoskeletal: No acute or suspicious bony abnormalities are noted. IMPRESSION: 1. Equivocal mild circumferential wall thickening of a few LEFT abdominal small bowel loops which could represent a mild enteritis. Fluid within nondistended small bowel and colon/rectum suggests a diarrheal state. No evidence of bowel obstruction. 2. No other significant abnormalities. Electronically Signed   By: JMargarette CanadaM.D.   On: 01/10/2022 19:18   DG Chest Portable 1 View  Result Date: 01/10/2022 CLINICAL DATA:  Asthma exacerbation.  Sepsis EXAM: PORTABLE CHEST 1 VIEW COMPARISON:  12/04/2018 FINDINGS: The heart size and mediastinal contours are within normal limits. Mild chronic bronchitic changes are again noted and appears similar to previous exam. No superimposed airspace consolidation, pleural effusion or interstitial edema. The visualized skeletal  structures are unremarkable. IMPRESSION: No active disease. Electronically Signed   By: TKerby MoorsM.D.   On: 01/10/2022 16:37      Impression / Plan:   Sally SURACEis a 36y.o. female with no significant past medical history who is admitted with norovirus gastroenteritis, clinically improving with no signs of mesenteric ischemia.  Patient does not have any abdominal pain, tolerating p.o. well.  Diarrhea is also improving.  Mildly elevated lactic acid is secondary to dehydration which is expected to improve.  No further recommendations from GI standpoint at this time.  If patient's diarrhea is worsened, recommend repeat stool studies, otherwise patient can be discharged home today  Thank you  for involving me in the care of this patient.  Please call us back with questions or concerns    LOS: 1 day   Sherri Sear, MD  01/12/2022, 3:59 PM    Note: This dictation was prepared with Dragon dictation along with smaller phrase technology. Any transcriptional errors that result from this process are unintentional.

## 2022-01-12 NOTE — Progress Notes (Signed)
Lactic resulted 2.3, MD notified.

## 2022-01-12 NOTE — Progress Notes (Signed)
Patient discharging to home. Both Ivs removed, cath tips intact. Discharge instructions and medications reviewed with patient, verbalized understanding.

## 2022-01-12 NOTE — Discharge Summary (Signed)
Discharge Summary  Sally Finley XMI:[id number] DOB: [birth year]  PCP: Patient, No Pcp Per (Inactive)  Admit date: 01/10/2022 Discharge date: 01/12/2022  Time spent: 35 minutes  Recommendations for Outpatient Follow-up:  Follow-up with your PCP Take your medications as prescribed  Discharge Diagnoses:  Active Hospital Problems   Diagnosis Date Noted   Acute gastroenteritis 01/10/2022   Acute respiratory failure with hypoxia (HCC) 01/10/2022   Severe sepsis (HCC) 01/10/2022   Mild intermittent asthma with acute exacerbation 01/10/2022    Resolved Hospital Problems  No resolved problems to display.    Discharge Condition: Stable  Diet recommendation: Resume previous diet  Vitals:   01/12/22 1115 01/12/22 1523  BP: 119/84 131/88  Pulse: 81 87  Resp: 18 18  Temp: (!) 97.4 F (36.3 C) 98.3 F (36.8 C)  SpO2: 99% 98%    History of present illness:  Sally Finley is a 36 y.o. female with medical history significant of Asthma who presents to [facility] ED with a 3-day history of shortness of breath, cough and wheezing associated with chest tightness with development on the day of arrival of nausea, vomiting and diarrhea.  Work-up revealed severe sepsis secondary to acute gastroenteritis with leukocytosis 16,000, lactic acidosis 3.2 tachycardia, tachypnea hypotension, hypoxia with O2 saturation in the 80s.  CT abdomen pelvis with contrast showed mild enteritis, no evidence of bowel obstruction.  Due to concern for possible intra-abdominal infection she was started on IV antibiotics Rocephin and IV Flagyl on 01/11/22.  Repeat lactic acid elevated, likely secondary to dehydration.  She received IV fluid.  Seen by GI, okay to discharge.   01/12/2022: Seen and examined at bedside.  She feels better.  Nausea and abdominal pain have improved.  Tolerating a diet.  She is eager to go home.     Hospital Course:  Principal Problem:   Acute gastroenteritis Active Problems:   Acute respiratory failure  with hypoxia (HCC)   Severe sepsis (HCC)   Mild intermittent asthma with acute exacerbation  Severe sepsis secondary to acute gastroenteritis/mild enteritis seen on CT scan. Patient with tachycardia and tachypnea, hypoxia and hypotension, leukocytosis and lactic acidosis Received IV fluid hydration, IV antiemetics Received IV Rocephin, IV Flagyl for persistent leukocytosis, tachycardia, leukocytosis Advance diet as tolerated. Repeated lactic acid 2.3 on 01/12/2022, likely secondary to dehydration. Nonseptic appearing and wants to go home.  Cleared by GI for discharge.   Resolved post repletion: Hypokalemia in the setting of diarrheal illness Diarrhea has resolved. Serum potassium 3.9 on 01/12/2022   Resolved post repletion: Hypomagnesemia Serum magnesium 1.4> 2.3   Resolved mild intermittent asthma with acute exacerbation Received bronchodilators as needed, steroids  O2 saturation 99% on room air.   Resolved acute respiratory failure with hypoxia [facility])       Advance Care Planning:   Code Status: Full Code   Consults: GI   Family Communication: Wife at bedside       Code Status: Full code       Antimicrobials: Rocephin IV Flagyl     Discharge Exam: BP 131/88 (BP Location: Right Arm)    Pulse 87    Temp 98.3 F (36.8 C)    Resp 18    Ht 5' (1.524 m)    Wt 77.1 kg    SpO2 98%    BMI 33.20 kg/m  General: 36 y.o. year-old female well developed well nourished in no acute distress.  Alert and oriented x3. Cardiovascular: Regular rate and rhythm with no rubs or gallops.  No thyromegaly or JVD noted.   Respiratory: Clear to auscultation with no wheezes or rales. Good inspiratory effort. Abdomen: Soft nontender nondistended with normal bowel sounds x4 quadrants. Musculoskeletal: No lower extremity edema. 2/4 pulses in all 4 extremities. Skin: No ulcerative lesions noted or rashes, Psychiatry: Mood is appropriate for condition and setting  Discharge Instructions You were  cared for by a hospitalist during your hospital stay. If you have any questions about your discharge medications or the care you received while you were in the hospital after you are discharged, you can call the unit and asked to speak with the hospitalist on call if the hospitalist that took care of you is not available. Once you are discharged, your primary care physician will handle any further medical issues. Please note that NO REFILLS for any discharge medications will be authorized once you are discharged, as it is imperative that you return to your primary care physician (or establish a relationship with a primary care physician if you do not have one) for your aftercare needs so that they can reassess your need for medications and monitor your lab values.   Allergies as of 01/12/2022       Reactions   Morphine And Related    Phenergan [promethazine Hcl]         Medication List     STOP taking these medications    cyclobenzaprine 10 MG tablet Commonly known as: FLEXERIL   meloxicam 15 MG tablet Commonly known as: MOBIC       TAKE these medications    albuterol 108 (90 Base) MCG/ACT inhaler Commonly known as: VENTOLIN HFA Inhale 2 puffs into the lungs every 6 (six) hours as needed for wheezing or shortness of breath. What changed:  when to take this reasons to take this   cephALEXin 500 MG capsule Commonly known as: KEFLEX Take 1 capsule (500 mg total) by mouth every 8 (eight) hours for 5 days.   metroNIDAZOLE 500 MG tablet Commonly known as: FLAGYL Take 1 tablet (500 mg total) by mouth every 12 (twelve) hours for 5 days.   nicotine 14 mg/24hr patch Commonly known as: NICODERM CQ - dosed in mg/24 hours Place 1 patch (14 mg total) onto the skin daily. Start taking on: January 13, 2022   ondansetron 4 MG disintegrating tablet Commonly known as: ZOFRAN-ODT Take 1 tablet (4 mg total) by mouth every 8 (eight) hours as needed.       Allergies  Allergen Reactions    Morphine And Related    Phenergan [Promethazine Hcl]       The results of significant diagnostics from this hospitalization (including imaging, microbiology, ancillary and laboratory) are listed below for reference.    Significant Diagnostic Studies: CT ABDOMEN PELVIS W CONTRAST  Result Date: 01/10/2022 CLINICAL DATA:  36 year old female with LEFT abdominal and pelvic pain and leukocytosis. EXAM: CT ABDOMEN AND PELVIS WITH CONTRAST TECHNIQUE: Multidetector CT imaging of the abdomen and pelvis was performed using the standard protocol following bolus administration of intravenous contrast. RADIATION DOSE REDUCTION: This exam was performed according to the departmental dose-optimization program which includes automated exposure control, adjustment of the mA and/or kV according to patient size and/or use of iterative reconstruction technique. CONTRAST:  100mL OMNIPAQUE IOHEXOL 300 MG/ML  SOLN COMPARISON:  12/04/2018 CT FINDINGS: Lower chest: Minimal basilar atelectasis noted. Hepatobiliary: The liver and gallbladder are unremarkable. There is no evidence of intrahepatic or extrahepatic biliary dilatation. Pancreas: Unremarkable Spleen: Unremarkable Adrenals/Urinary Tract: The kidneys, adrenal glands  and bladder are unremarkable. Stomach/Bowel: There is equivocal mild circumferential wall thickening of a few small bowel loops within the LEFT abdomen. Fluid within nondistended small bowel and colon/rectum are noted. There is no evidence of bowel obstruction. The appendix is normal. Vascular/Lymphatic: No significant vascular findings are present. No enlarged abdominal or pelvic lymph nodes. Reproductive: Uterus and bilateral adnexa are unremarkable. Other: No ascites, focal collection or pneumoperitoneum. Musculoskeletal: No acute or suspicious bony abnormalities are noted. IMPRESSION: 1. Equivocal mild circumferential wall thickening of a few LEFT abdominal small bowel loops which could represent a mild  enteritis. Fluid within nondistended small bowel and colon/rectum suggests a diarrheal state. No evidence of bowel obstruction. 2. No other significant abnormalities. Electronically Signed   By: Harmon PierJeffrey  Hu M.D.   On: 01/10/2022 19:18   DG Chest Portable 1 View  Result Date: 01/10/2022 CLINICAL DATA:  Asthma exacerbation.  Sepsis EXAM: PORTABLE CHEST 1 VIEW COMPARISON:  12/04/2018 FINDINGS: The heart size and mediastinal contours are within normal limits. Mild chronic bronchitic changes are again noted and appears similar to previous exam. No superimposed airspace consolidation, pleural effusion or interstitial edema. The visualized skeletal structures are unremarkable. IMPRESSION: No active disease. Electronically Signed   By: Signa Kellaylor  Stroud M.D.   On: 01/10/2022 16:37    Microbiology: Recent Results (from the past 240 hour(s))  Urine Culture     Status: Abnormal   Collection Time: 01/10/22  3:34 PM   Specimen: In/Out Cath Urine  Result Value Ref Range Status   Specimen Description   Final    IN/OUT CATH URINE Performed at [facility], [address] [place], [place] [address]    Special Requests   Final    NONE Performed at [facility], [address] [place], [place] [address]    Culture MULTIPLE SPECIES PRESENT, SUGGEST RECOLLECTION (A)  Final   Report Status 01/12/2022 FINAL  Final  Blood Culture (routine x 2)     Status: None (Preliminary result)   Collection Time: 01/10/22  5:13 PM   Specimen: BLOOD  Result Value Ref Range Status   Specimen Description BLOOD RIGHT ANTECUBITAL  Final   Special Requests   Final    BOTTLES DRAWN AEROBIC AND ANAEROBIC Blood Culture adequate volume   Culture   Final    NO GROWTH 2 DAYS Performed at [facility], [address] [place], [place] [address]    Report Status PENDING  Incomplete  Blood Culture (routine x 2)     Status: None (Preliminary result)   Collection Time: 01/10/22  5:13 PM   Specimen: BLOOD   Result Value Ref Range Status   Specimen Description BLOOD BLOOD LEFT ARM  Final   Special Requests   Final    BOTTLES DRAWN AEROBIC AND ANAEROBIC Blood Culture adequate volume   Culture   Final    NO GROWTH 2 DAYS Performed at [facility], [address] [place], [place] [address]    Report Status PENDING  Incomplete  Resp Panel by RT-PCR (Flu A&B, Covid) Nasopharyngeal Swab     Status: None   Collection Time: 01/10/22  5:13 PM   Specimen: Nasopharyngeal Swab; Nasopharyngeal(NP) swabs in vial transport medium  Result Value Ref Range Status   SARS Coronavirus 2 by RT PCR NEGATIVE NEGATIVE Final    Comment: (NOTE) SARS-CoV-2 target nucleic acids are NOT DETECTED.  The SARS-CoV-2 RNA is generally detectable in upper respiratory specimens during the acute phase of infection. The lowest concentration  of SARS-CoV-2 viral copies this assay can detect is 138 copies/mL. A negative result does not preclude SARS-Cov-2 infection and should not be used as the sole basis for treatment or other patient management decisions. A negative result may occur with  improper specimen collection/handling, submission of specimen other than nasopharyngeal swab, presence of viral mutation(s) within the areas targeted by this assay, and inadequate number of viral copies(<138 copies/mL). A negative result must be combined with clinical observations, patient history, and epidemiological information. The expected result is Negative.  Fact Sheet for Patients:  BloggerCourse.com  Fact Sheet for Healthcare Providers:  SeriousBroker.it  This test is no t yet approved or cleared by the [place] FDA and  has been authorized for detection and/or diagnosis of SARS-CoV-2 by FDA under an Emergency Use Authorization (EUA). This EUA will remain  in effect (meaning this test can be used) for the duration of the COVID-19 declaration under Section  564(b)(1) of the Act, 21 U.S.C.section 360bbb-3(b)(1), unless the authorization is terminated  or revoked sooner.       Influenza A by PCR NEGATIVE NEGATIVE Final   Influenza B by PCR NEGATIVE NEGATIVE Final    Comment: (NOTE) The Xpert Xpress SARS-CoV-2/FLU/RSV plus assay is intended as an aid in the diagnosis of influenza from Nasopharyngeal swab specimens and should not be used as a sole basis for treatment. Nasal washings and aspirates are unacceptable for Xpert Xpress SARS-CoV-2/FLU/RSV testing.  Fact Sheet for Patients: BloggerCourse.com  Fact Sheet for Healthcare Providers: SeriousBroker.it  This test is not yet approved or cleared by the [place] FDA and has been authorized for detection and/or diagnosis of SARS-CoV-2 by FDA under an Emergency Use Authorization (EUA). This EUA will remain in effect (meaning this test can be used) for the duration of the COVID-19 declaration under Section 564(b)(1) of the Act, 21 U.S.C. section 360bbb-3(b)(1), unless the authorization is terminated or revoked.  Performed at [facility], [address] [place], [place] [address]   Gastrointestinal Panel by PCR , Stool     Status: Abnormal   Collection Time: 01/11/22  3:45 PM   Specimen: Stool  Result Value Ref Range Status   Campylobacter species NOT DETECTED NOT DETECTED Final   Plesimonas shigelloides NOT DETECTED NOT DETECTED Final   Salmonella species NOT DETECTED NOT DETECTED Final   Yersinia enterocolitica NOT DETECTED NOT DETECTED Final   Vibrio species NOT DETECTED NOT DETECTED Final   Vibrio cholerae NOT DETECTED NOT DETECTED Final   Enteroaggregative E coli (EAEC) NOT DETECTED NOT DETECTED Final   Enteropathogenic E coli (EPEC) NOT DETECTED NOT DETECTED Final   Enterotoxigenic E coli (ETEC) NOT DETECTED NOT DETECTED Final   Shiga like toxin producing E coli (STEC) NOT DETECTED NOT DETECTED Final    Shigella/Enteroinvasive E coli (EIEC) NOT DETECTED NOT DETECTED Final   Cryptosporidium NOT DETECTED NOT DETECTED Final   Cyclospora cayetanensis NOT DETECTED NOT DETECTED Final   Entamoeba histolytica NOT DETECTED NOT DETECTED Final   Giardia lamblia NOT DETECTED NOT DETECTED Final   Adenovirus F40/41 NOT DETECTED NOT DETECTED Final   Astrovirus NOT DETECTED NOT DETECTED Final   Norovirus GI/GII DETECTED (A) NOT DETECTED Final    Comment: RESULT CALLED TO, READ BACK BY AND VERIFIED WITH: LYDIA ILLERBRUN 01/11/22 1758 AMK    Rotavirus A NOT DETECTED NOT DETECTED Final   Sapovirus (I, II, IV, and V) NOT DETECTED NOT DETECTED Final    Comment: Performed at [facility], [address]  [place], [place] [address]  C Difficile Quick Screen w PCR reflex     Status: None   Collection Time: 01/11/22  3:45 PM   Specimen: Stool  Result Value Ref Range Status   C Diff antigen NEGATIVE NEGATIVE Final   C Diff toxin NEGATIVE NEGATIVE Final   C Diff interpretation No C. difficile detected.  Final    Comment: Performed at [facility], [address] [place], [place] [address]     Labs: Basic Metabolic Panel: Recent Labs  Lab 01/10/22 1534 01/11/22 0543 01/12/22 0438  NA 138 138 139  K 4.0 3.3* 3.9  CL 107 108 110  CO2 24 23 24   GLUCOSE 129* 135* 111*  BUN 12 6 7   CREATININE 0.77 0.69 0.66  CALCIUM 8.9 8.8* 8.6*  MG 1.5* 1.4* 2.3  PHOS  --  2.6 3.0   Liver Function Tests: Recent Labs  Lab 01/10/22 1534  AST 18  ALT 17  ALKPHOS 57  BILITOT 0.6  PROT 7.4  ALBUMIN 3.9   Recent Labs  Lab 01/10/22 1534  LIPASE 27   No results for input(s): AMMONIA in the last 168 hours. CBC: Recent Labs  Lab 01/10/22 1534 01/11/22 0543 01/12/22 0438  WBC 16.8* 14.7* 16.0*  NEUTROABS 15.7*  --   --   HGB 15.2* 13.0 12.2  HCT 45.6 38.1 35.9*  MCV 92.1 90.5 90.4  PLT 215 183 180   Cardiac Enzymes: No results for input(s): CKTOTAL, CKMB, CKMBINDEX, TROPONINI in the  last 168 hours. BNP: BNP (last 3 results) No results for input(s): BNP in the last 8760 hours.  ProBNP (last 3 results) No results for input(s): PROBNP in the last 8760 hours.  CBG: No results for input(s): GLUCAP in the last 168 hours.     Signed:  Darlin Drop, MD [facility] 01/12/2022, 3:49 PM

## 2022-01-15 LAB — CULTURE, BLOOD (ROUTINE X 2)
Culture: NO GROWTH
Culture: NO GROWTH
Special Requests: ADEQUATE
Special Requests: ADEQUATE

## 2022-11-02 ENCOUNTER — Ambulatory Visit
Admission: EM | Admit: 2022-11-02 | Discharge: 2022-11-02 | Disposition: A | Discharge status: Home / Self Care | Payer: Self-pay | Attending: Urgent Care | Admitting: Urgent Care

## 2022-11-02 DIAGNOSIS — J4521 Mild intermittent asthma with (acute) exacerbation: Secondary | ICD-10-CM

## 2022-11-02 MED ORDER — ALBUTEROL SULFATE HFA 108 (90 BASE) MCG/ACT IN AERS: 2.0000 | INHALATION_SPRAY | Freq: Four times a day (QID) | RESPIRATORY_TRACT | 0 refills | Status: AC | PRN

## 2022-11-02 MED ORDER — PREDNISONE 20 MG PO TABS: ORAL_TABLET | ORAL | 0 refills | Status: AC

## 2022-11-02 MED ORDER — DEXAMETHASONE SODIUM PHOSPHATE 10 MG/ML IJ SOLN
10.0000 mg | Freq: Once | INTRAMUSCULAR | Status: AC
  Administered 2022-11-02: 10 mg via INTRAMUSCULAR

## 2022-11-02 MED ORDER — ALBUTEROL SULFATE (2.5 MG/3ML) 0.083% IN NEBU
2.5000 mg | INHALATION_SOLUTION | Freq: Once | RESPIRATORY_TRACT | Status: AC
  Administered 2022-11-02: 2.5 mg via RESPIRATORY_TRACT

## 2022-11-02 NOTE — Discharge Instructions (Addendum)
Please establish care with a primary care provider who can manage your need for asthma medications in the future.

## 2022-11-02 NOTE — ED Provider Notes (Signed)
[facility]    CSN: [id number] Arrival date & time: 11/02/22  1306      History   Chief Complaint Chief Complaint  Patient presents with   Cough   Shortness of Breath    HPI Sally Finley is a 36 y.o. female.    Cough Associated symptoms: shortness of breath   Shortness of Breath Associated symptoms: cough     Presents to urgent care with complaint of wheezing, shortness of breath, "cold symptoms".  Patient has been taking OTC cough medication without relief.  She endorses some chest pain.  Patient states symptoms started with a cold and now is caused exacerbation of her asthma.  She is not under the care of a primary care provider and has no rescue inhaler at home.  Denies fever, chills, myalgias.  Denies nausea, vomiting, diarrhea.  Past Medical History:  Diagnosis Date   Asthma     Patient Active Problem List   Diagnosis Date Noted   Acute gastroenteritis 01/10/2022   Acute respiratory failure with hypoxia (HCC) 01/10/2022   Severe sepsis (HCC) 01/10/2022   Mild intermittent asthma with acute exacerbation 01/10/2022    History reviewed. No pertinent surgical history.  OB History   No obstetric history on file.      Home Medications    Prior to Admission medications   Medication Sig Start Date End Date Taking? Authorizing Provider  albuterol (VENTOLIN HFA) 108 (90 Base) MCG/ACT inhaler Inhale 2 puffs into the lungs every 6 (six) hours as needed for wheezing or shortness of breath. 01/10/22   Delton Prairie, MD  nicotine (NICODERM CQ - DOSED IN MG/24 HOURS) 14 mg/24hr patch Place 1 patch (14 mg total) onto the skin daily. 01/13/22   Darlin Drop, DO  ondansetron (ZOFRAN-ODT) 4 MG disintegrating tablet Take 1 tablet (4 mg total) by mouth every 8 (eight) hours as needed. 01/10/22   Delton Prairie, MD  dicyclomine (BENTYL) 20 MG tablet Take 1 tablet (20 mg total) by mouth 3 (three) times daily as needed for spasms. 05/17/19 08/12/19  Minna Antis, MD   fluticasone (FLONASE) 50 MCG/ACT nasal spray Place 1 spray into both nostrils daily. 07/24/18 08/21/19  Arnaldo Natal, MD    Family History Family History  Problem Relation Age of Onset   Multiple sclerosis Mother     Social History Social History   Tobacco Use   Smoking status: Every Day    Packs/day: 0.50    Types: Cigarettes   Smokeless tobacco: Never  Vaping Use   Vaping Use: Every day  Substance Use Topics   Alcohol use: No   Drug use: Yes    Types: Marijuana     Allergies   Morphine and related and Phenergan [promethazine hcl]   Review of Systems Review of Systems  Respiratory:  Positive for cough and shortness of breath.      Physical Exam Triage Vital Signs ED Triage Vitals  Enc Vitals Group     BP 11/02/22 1344 138/78     Pulse Rate 11/02/22 1344 66     Resp 11/02/22 1344 20     Temp 11/02/22 1344 97.9 F (36.6 C)     Temp Source 11/02/22 1344 Oral     SpO2 11/02/22 1344 93 %     Weight --      Height --      Head Circumference --      Peak Flow --      Pain Score 11/02/22  1346 5     Pain Loc --      Pain Edu? --      Excl. in GC? --    No data found.  Updated Vital Signs BP 138/78 (BP Location: Left Arm)   Pulse 66   Temp 97.9 F (36.6 C) (Oral)   Resp 20   LMP 10/22/2022   SpO2 93%   Visual Acuity Right Eye Distance:   Left Eye Distance:   Bilateral Distance:    Right Eye Near:   Left Eye Near:    Bilateral Near:     Physical Exam Vitals reviewed.  Constitutional:      Appearance: She is well-developed. She is not ill-appearing.  Cardiovascular:     Rate and Rhythm: Normal rate and regular rhythm.  Pulmonary:     Effort: Pulmonary effort is normal.     Breath sounds: Wheezing present.  Skin:    General: Skin is warm and dry.  Neurological:     General: No focal deficit present.     Mental Status: She is alert and oriented to person, place, and time.  Psychiatric:        Mood and Affect: Mood normal.         Behavior: Behavior normal.      UC Treatments / Results  Labs (all labs ordered are listed, but only abnormal results are displayed) Labs Reviewed - No data to display  EKG   Radiology No results found.  Procedures Procedures (including critical care time)  Medications Ordered in UC Medications - No data to display  Initial Impression / Assessment and Plan / UC Course  I have reviewed the triage vital signs and the nursing notes.  Pertinent labs & imaging results that were available during my care of the patient were reviewed by me and considered in my medical decision making (see chart for details).   Patient is afebrile here without recent antipyretics. Satting 93% on room air. Overall is not ill-appearing, appears well hydrated, and without respiratory distress. Pulmonary exam is markable for wheezes in all lobes.  Treated for acute exacerbation of asthma with albuterol via neb in clinic with little improvement. Will admin decadron 10 mg IM in clinic and discharge with a course of prednisone.  Final Clinical Impressions(s) / UC Diagnoses   Final diagnoses:  None   Discharge Instructions   None    ED Prescriptions   None    PDMP not reviewed this encounter.   Charma Igo, [place] 11/02/22 1503

## 2022-11-02 NOTE — ED Triage Notes (Signed)
Pt has some wheezing SOB and cold symptoms. Took otc cough meds but no relief. Has some chest pain
